# Patient Record
Sex: Female | Born: 1980 | Race: Black or African American | Hispanic: No | Marital: Married | Smoking: Former smoker
Health system: Southern US, Community
[De-identification: ages and names within clinical notes are randomized; demographics above are authoritative.]

## PROBLEM LIST (undated history)

## (undated) DIAGNOSIS — D649 Anemia, unspecified: Secondary | ICD-10-CM

## (undated) DIAGNOSIS — I10 Essential (primary) hypertension: Secondary | ICD-10-CM

## (undated) DIAGNOSIS — Z789 Other specified health status: Secondary | ICD-10-CM

## (undated) HISTORY — DX: Anemia, unspecified: D64.9

## (undated) HISTORY — DX: Essential (primary) hypertension: I10

## (undated) HISTORY — DX: Other specified health status: Z78.9

---

## 2004-11-19 ENCOUNTER — Emergency Department (HOSPITAL_COMMUNITY): Admission: EM | Admit: 2004-11-19 | Discharge: 2004-11-20 | Payer: Self-pay | Admitting: Emergency Medicine

## 2005-11-20 ENCOUNTER — Emergency Department: Payer: Self-pay | Admitting: Emergency Medicine

## 2007-01-04 ENCOUNTER — Emergency Department: Payer: Self-pay | Admitting: Emergency Medicine

## 2007-09-23 ENCOUNTER — Emergency Department (HOSPITAL_COMMUNITY): Admission: EM | Admit: 2007-09-23 | Discharge: 2007-09-23 | Payer: Self-pay | Admitting: Emergency Medicine

## 2009-07-21 ENCOUNTER — Other Ambulatory Visit: Admission: RE | Admit: 2009-07-21 | Discharge: 2009-07-21 | Payer: Self-pay | Admitting: Obstetrics & Gynecology

## 2010-12-03 ENCOUNTER — Emergency Department (HOSPITAL_COMMUNITY)
Admission: EM | Admit: 2010-12-03 | Discharge: 2010-12-04 | Disposition: A | Discharge status: Home / Self Care | Payer: Commercial Indemnity | Attending: Emergency Medicine | Admitting: Emergency Medicine

## 2010-12-03 ENCOUNTER — Emergency Department (HOSPITAL_COMMUNITY): Payer: Commercial Indemnity

## 2010-12-03 ENCOUNTER — Encounter: Payer: Self-pay | Admitting: *Deleted

## 2010-12-03 DIAGNOSIS — R102 Pelvic and perineal pain: Secondary | ICD-10-CM

## 2010-12-03 DIAGNOSIS — R1032 Left lower quadrant pain: Secondary | ICD-10-CM | POA: Insufficient documentation

## 2010-12-03 DIAGNOSIS — N898 Other specified noninflammatory disorders of vagina: Secondary | ICD-10-CM | POA: Insufficient documentation

## 2010-12-03 DIAGNOSIS — R109 Unspecified abdominal pain: Secondary | ICD-10-CM | POA: Insufficient documentation

## 2010-12-03 DIAGNOSIS — N739 Female pelvic inflammatory disease, unspecified: Secondary | ICD-10-CM

## 2010-12-03 DIAGNOSIS — R1031 Right lower quadrant pain: Secondary | ICD-10-CM | POA: Insufficient documentation

## 2010-12-03 DIAGNOSIS — D72829 Elevated white blood cell count, unspecified: Secondary | ICD-10-CM | POA: Insufficient documentation

## 2010-12-03 DIAGNOSIS — K5900 Constipation, unspecified: Secondary | ICD-10-CM | POA: Insufficient documentation

## 2010-12-03 DIAGNOSIS — N949 Unspecified condition associated with female genital organs and menstrual cycle: Secondary | ICD-10-CM | POA: Insufficient documentation

## 2010-12-03 LAB — COMPREHENSIVE METABOLIC PANEL
ALT: 15 U/L (ref 0–35)
Alkaline Phosphatase: 66 U/L (ref 39–117)
Chloride: 105 mEq/L (ref 96–112)
GFR calc Af Amer: >60 mL/min (ref >60)
Glucose, Bld: 107 mg/dL — ABNORMAL HIGH (ref 70–99)
Potassium: 3.6 mEq/L (ref 3.5–5.1)
Sodium: 138 mEq/L (ref 135–145)
Total Bilirubin: 0.6 mg/dL (ref 0.3–1.2)
Total Protein: 7.3 g/dL (ref 6.0–8.3)

## 2010-12-03 LAB — URINALYSIS, ROUTINE W REFLEX MICROSCOPIC
Bilirubin Urine: NEGATIVE
Glucose, UA: NEGATIVE mg/dL
Ketones, ur: NEGATIVE mg/dL
Nitrite: NEGATIVE
Protein, ur: NEGATIVE mg/dL
pH: 6 (ref 5.0–8.0)

## 2010-12-03 LAB — CBC
Hemoglobin: 11 g/dL — ABNORMAL LOW (ref 12.0–15.0)
Platelets: 275 10*3/uL (ref 150–400)
RBC: 4.99 MIL/uL (ref 3.87–5.11)
WBC: 19.8 10*3/uL — ABNORMAL HIGH (ref 4.0–10.5)

## 2010-12-03 LAB — DIFFERENTIAL
Eosinophils Absolute: 0 10*3/uL (ref 0.0–0.7)
Lymphocytes Relative: 11 % — ABNORMAL LOW (ref 12–46)
Lymphs Abs: 2.2 10*3/uL (ref 0.7–4.0)
Monocytes Relative: 7 % (ref 3–12)
Neutro Abs: 16.2 10*3/uL — ABNORMAL HIGH (ref 1.7–7.7)
Neutrophils Relative %: 82 % — ABNORMAL HIGH (ref 43–77)

## 2010-12-03 MED ORDER — MORPHINE SULFATE 4 MG/ML IJ SOLN
4.0000 mg | Freq: Once | INTRAMUSCULAR | Status: AC
  Administered 2010-12-03: 4 mg via INTRAVENOUS
  Filled 2010-12-03: qty 1

## 2010-12-03 MED ORDER — ONDANSETRON HCL 4 MG/2ML IJ SOLN
4.0000 mg | Freq: Once | INTRAMUSCULAR | Status: AC
  Administered 2010-12-03: 4 mg via INTRAVENOUS
  Filled 2010-12-03: qty 2

## 2010-12-03 MED ORDER — SODIUM CHLORIDE 0.9 % IV SOLN: 999.0000 mL | INTRAVENOUS | Status: DC

## 2010-12-03 MED ORDER — IOHEXOL 300 MG/ML  SOLN
100.0000 mL | Freq: Once | INTRAMUSCULAR | Status: AC | PRN
  Administered 2010-12-03: 100 mL via INTRAVENOUS

## 2010-12-03 MED ORDER — KETOROLAC TROMETHAMINE 30 MG/ML IJ SOLN
30.0000 mg | Freq: Once | INTRAMUSCULAR | Status: AC
  Administered 2010-12-03: 30 mg via INTRAVENOUS
  Filled 2010-12-03: qty 1

## 2010-12-03 NOTE — ED Notes (Signed)
Pt c/o lower abdominal cramping x 2 weeks. States that it got worse last night. Pt also c/o vaginal bleeding since July. Pt states that she was seen by her MD in July and was given some pills but they did not work. Denies nausea, vomiting or diarrhea.

## 2010-12-03 NOTE — ED Notes (Signed)
Pt ambulated to BR with steady gate.

## 2010-12-03 NOTE — ED Notes (Signed)
Pt states she was unable to urinate.

## 2010-12-04 ENCOUNTER — Encounter (HOSPITAL_COMMUNITY): Payer: Self-pay | Admitting: Emergency Medicine

## 2010-12-04 LAB — WET PREP, GENITAL: Trich, Wet Prep: NONE SEEN

## 2010-12-04 MED ORDER — METRONIDAZOLE 500 MG PO TABS: 500.0000 mg | ORAL_TABLET | Freq: Two times a day (BID) | ORAL | Status: AC

## 2010-12-04 MED ORDER — DOXYCYCLINE HYCLATE 100 MG PO CAPS: 100.0000 mg | ORAL_CAPSULE | Freq: Two times a day (BID) | ORAL | Status: AC

## 2010-12-04 MED ORDER — CEFTRIAXONE SODIUM 250 MG IJ SOLR
250.0000 mg | Freq: Once | INTRAMUSCULAR | Status: AC
  Administered 2010-12-04: 250 mg via INTRAMUSCULAR
  Filled 2010-12-04: qty 250

## 2010-12-04 MED ORDER — HYDROCODONE-ACETAMINOPHEN 5-325 MG PO TABS: ORAL_TABLET | ORAL | Status: AC

## 2010-12-04 MED ORDER — LIDOCAINE HCL 2 % IJ SOLN
INTRAMUSCULAR | Status: AC
  Administered 2010-12-04: 01:00:00
  Filled 2010-12-04: qty 1

## 2010-12-04 NOTE — ED Provider Notes (Signed)
History     CSN: [id number] Arrival date & time: 12/03/2010  7:06 PM  Chief Complaint  Patient presents with  . Abdominal Pain   HPI Comments: Patient c/o diffuse lower abdominal pain and constipation for several days.  States it has been several days since her last bowel movement.  She has not tried any OTC laxatives or stool softeners.  She also states that she has had abnormal periods and persistent vaginal bleeding for 2 months.  She has been seen for this by her GYN and is currently taking Megace.  She denies fever, vomiting, urinary sx's or chest pain or back pain  Patient is a 30 y.o. female presenting with abdominal pain. The history is provided by the patient.  Abdominal Pain The primary symptoms of the illness include abdominal pain and vaginal bleeding. The primary symptoms of the illness do not include fever, shortness of breath, nausea, vomiting, diarrhea, dysuria or vaginal discharge. The current episode started more than 2 days ago. The onset of the illness was gradual. The problem has not changed since onset. The abdominal pain began more than 2 days ago. The pain came on gradually. The abdominal pain has been unchanged since its onset. The abdominal pain is located in the LLQ, RLQ and suprapubic region. The abdominal pain does not radiate. The abdominal pain is relieved by nothing. Exacerbated by: nothing.  The patient states that she believes she is currently not pregnant. The patient has had a change in bowel habit. Additional symptoms associated with the illness include constipation. Symptoms associated with the illness do not include chills, diaphoresis, urgency, hematuria, frequency or back pain. Associated medical issues comments: has hx of abnormal vagianl bleeding and uterine fibroids.    History reviewed. No pertinent past medical history.  History reviewed. No pertinent past surgical history.  History reviewed. No pertinent family history.  History  Substance Use  Topics  . Smoking status: Never Smoker   . Smokeless tobacco: Not on file  . Alcohol Use: No    OB History    Grav Para Term Preterm Abortions TAB SAB Ect Mult Living                  Review of Systems  Constitutional: Negative for fever, chills and diaphoresis.  HENT: Negative for neck pain and neck stiffness.   Respiratory: Negative for chest tightness and shortness of breath.   Cardiovascular: Negative.   Gastrointestinal: Positive for abdominal pain and constipation. Negative for nausea, vomiting, diarrhea, anal bleeding and rectal pain.  Genitourinary: Positive for vaginal bleeding and menstrual problem. Negative for dysuria, urgency, frequency, hematuria, vaginal discharge and vaginal pain.  Musculoskeletal: Negative for back pain.  All other systems reviewed and are negative.    Physical Exam  BP 126/73  Pulse 93  Temp(Src) 99.1 F (37.3 C) (Oral)  Resp 20  Ht 5\' 4"  (1.626 m)  Wt 220 lb (99.791 kg)  BMI 37.76 kg/m2  SpO2 98%  LMP 10/08/2010  Physical Exam  Constitutional: She appears well-developed and well-nourished. No distress.  HENT:  Head: Normocephalic and atraumatic.  Mouth/Throat: Oropharynx is clear and moist.  Neck: Normal range of motion. Neck supple. No JVD present. No thyromegaly present.  Cardiovascular: Normal rate, regular rhythm and normal heart sounds.   No murmur heard. Pulmonary/Chest: Effort normal and breath sounds normal.  Abdominal: Soft. Bowel sounds are normal. She exhibits no distension, no ascites and no mass. There is no hepatosplenomegaly. There is tenderness in the right lower  quadrant and left lower quadrant. There is no rebound, no guarding, no CVA tenderness and no tenderness at McBurney's point.  Genitourinary: Rectal exam shows no external hemorrhoid, no mass and no tenderness. Guaiac negative stool. There is no rash, tenderness or lesion on the right labia. There is no rash, tenderness or lesion on the left labia. Uterus is  enlarged. Cervix exhibits motion tenderness. Right adnexum displays no mass and no tenderness. Left adnexum displays no mass and no tenderness. There is bleeding around the vagina. No tenderness around the vagina. No foreign body around the vagina.       Palpable mass anterior to the cervix, watery fluid mixed with blood in the vaginal vault.  Bilateral adnexa not well palpated due to body habitus.  Rectal exam resulted in soft, bown heme negative stool.  No obvious fecal impaction   Musculoskeletal: She exhibits no tenderness.  Lymphadenopathy:    She has no cervical adenopathy.  Neurological: She is alert. She has normal reflexes. No cranial nerve deficit. She exhibits normal muscle tone. Coordination normal.  Skin: Skin is warm and dry.  Psychiatric: She has a normal mood and affect.    ED Course  Procedures  MDM   Patient c/o diffuse lower abd pain and cramping for 2 weeks.  C/o constipation and rectal exam was performed.  GC and chlamydia cultures pending.  Pt has a palpable mass anterior to the cervix with mild CMT, uterus feels enlarged.  She is non-toxic appearing, has ambulated to the restroom w/o difficulty and vitals remain stable. Given the pt's leucocytosis,  I will begin abx  treatment for possible PID.  I have discussed the results with the patient , she agrees to close follow-up with Dr. Despina Hidden on Tuesday and verbalized understanding to me.  I have also discussed the hx, diagnostics and care plan with the EDP.   Patient / Family / Caregiver understand and agree with initial ED impression and plan with expectations set for ED visit.   Results for orders placed during the hospital encounter of 12/03/10  CBC      Component Value Range   WBC 19.8 (*) 4.0 - 10.5 (K/uL)   RBC 4.99  3.87 - 5.11 (MIL/uL)   Hemoglobin 11.0 (*) 12.0 - 15.0 (g/dL)   HCT 16.1 (*) 09.6 - 46.0 (%)   MCV 71.7 (*) 78.0 - 100.0 (fL)   MCH 22.0 (*) 26.0 - 34.0 (pg)   MCHC 30.7  30.0 - 36.0 (g/dL)   RDW 04.5  (*) 40.9 - 15.5 (%)   Platelets 275  150 - 400 (K/uL)  DIFFERENTIAL      Component Value Range   Neutrophils Relative 82 (*) 43 - 77 (%)   Neutro Abs 16.2 (*) 1.7 - 7.7 (K/uL)   Lymphocytes Relative 11 (*) 12 - 46 (%)   Lymphs Abs 2.2  0.7 - 4.0 (K/uL)   Monocytes Relative 7  3 - 12 (%)   Monocytes Absolute 1.5 (*) 0.1 - 1.0 (K/uL)   Eosinophils Relative 0  0 - 5 (%)   Eosinophils Absolute 0.0  0.0 - 0.7 (K/uL)   Basophils Relative 0  0 - 1 (%)   Basophils Absolute 0.0  0.0 - 0.1 (K/uL)  COMPREHENSIVE METABOLIC PANEL      Component Value Range   Sodium 138  135 - 145 (mEq/L)   Potassium 3.6  3.5 - 5.1 (mEq/L)   Chloride 105  96 - 112 (mEq/L)   CO2 21  19 -  32 (mEq/L)   Glucose, Bld 107 (*) 70 - 99 (mg/dL)   BUN 10  6 - 23 (mg/dL)   Creatinine, Ser 7.82  0.50 - 1.10 (mg/dL)   Calcium 9.0  8.4 - 95.6 (mg/dL)   Total Protein 7.3  6.0 - 8.3 (g/dL)   Albumin 3.6  3.5 - 5.2 (g/dL)   AST 14  0 - 37 (U/L)   ALT 15  0 - 35 (U/L)   Alkaline Phosphatase 66  39 - 117 (U/L)   Total Bilirubin 0.6  0.3 - 1.2 (mg/dL)   GFR calc non Af Amer >60  >60 (mL/min)   GFR calc Af Amer >60  >60 (mL/min)  URINALYSIS, ROUTINE W REFLEX MICROSCOPIC      Component Value Range   Color, Urine YELLOW  YELLOW    Appearance HAZY (*) CLEAR    Specific Gravity, Urine >1.030 (*) 1.005 - 1.030    pH 6.0  5.0 - 8.0    Glucose, UA NEGATIVE  NEGATIVE (mg/dL)   Hgb urine dipstick NEGATIVE  NEGATIVE    Bilirubin Urine NEGATIVE  NEGATIVE    Ketones, ur NEGATIVE  NEGATIVE (mg/dL)   Protein, ur NEGATIVE  NEGATIVE (mg/dL)   Urobilinogen, UA 0.2  0.0 - 1.0 (mg/dL)   Nitrite NEGATIVE  NEGATIVE    Leukocytes, UA NEGATIVE  NEGATIVE   POCT PREGNANCY, URINE      Component Value Range   Preg Test, Ur NEGATIVE    WET PREP, GENITAL      Component Value Range   Yeast, Wet Prep NONE SEEN  NONE SEEN    Trich, Wet Prep NONE SEEN  NONE SEEN    Clue Cells, Wet Prep FEW (*) NONE SEEN    WBC, Wet Prep HPF POC FEW (*) NONE SEEN          Dg Abd 1 View  12/03/2010  *RADIOLOGY REPORT*  Clinical Data: Lower abdominal pain and constipation  ABDOMEN - 1 VIEW  Comparison: None.  Findings: Normal bowel gas pattern with scattered gas and stool in the colon.  No bowel distension.  No radiopaque stones demonstrated.  Calcification in the pelvis consistent with phlebolith.  Visualized bones appear intact.  IMPRESSION: Nonobstructive bowel gas pattern.  Original Report Authenticated By: Marlon Pel, M.D.     Ct Abdomen Pelvis W Contrast  12/03/2010  *RADIOLOGY REPORT*  Clinical Data: Lower abdominal pain unable to urinate.  Vaginal bleeding since July.  CT ABDOMEN AND PELVIS WITH CONTRAST  Technique:  Multidetector CT imaging of the abdomen and pelvis was performed following the standard protocol during bolus administration of intravenous contrast.  Contrast: 100 ml Omnipaque 300  Comparison: None.  Findings: Lung bases are clear.  The liver, spleen, gallbladder, bile ducts, pancreas, adrenal glands, the stomach, small bowel, the kidneys, abdominal aorta, and retroperitoneal lymph nodes are unremarkable.  No free air free fluid in the abdomen.  There are scattered mesenteric and right lower quadrant lymph nodes which are mildly prominent probably representing reactive nodes.  Pelvis:  There is prominent expansion of a fluid-filled lower uterine segment, measuring up to about 5.1 x 11.5 x 4.6 cm.  The cervical region appears irregular.  Fluid extends up into the endometrium.  There is fluid and gas in the vaginal region. Changes are most worrisome for endometrial obstruction and a cervical mass should be excluded.  There is also a nodular enlargement of the uterus consistent with uterine fibroids. Ovaries are not significantly enlarged.  The  bladder is displaced and decompressed.  No free or loculated intrapelvic fluid collections.  No inflammatory changes in the colon.  The appendix is normal. No significant lymphadenopathy in the pelvis.   IMPRESSION: Marked fluid distension of the lower uterine segment extending into the endometrial canal.  Changes are worrisome for obstructing cervical lesion.  Multiple fibroids in the uterus is well.  Original Report Authenticated By: Marlon Pel, M.D.    MEDICATIONS GIVEN IN THE ED:   Medications  0.9 %  sodium chloride infusion (0 mL Intravenous Stopped 12/03/10 1943)  megestrol (MEGACE) 40 MG tablet (not administered)  ketorolac (TORADOL) injection 30 mg (30 mg Intravenous Given 12/03/10 1947)  ondansetron (ZOFRAN) injection 4 mg (4 mg Intravenous Given 12/03/10 1946)  morphine injection 4 mg (4 mg Intravenous Given 12/03/10 2254)  iohexol (OMNIPAQUE) 300 MG/ML injection 100 mL (100 mL Intravenous Contrast Given 12/03/10 2317)  cefTRIAXone (ROCEPHIN) injection 250 mg (250 mg Intramuscular Given 12/04/10 0113)  lidocaine (XYLOCAINE) 2 % (with pres) injection (   Given 12/04/10 0115)     OUTPATIENT MEDICATIONS PRESCRIBED FROM THE ED:      Patient's Medications  New Prescriptions   DOXYCYCLINE (VIBRAMYCIN) 100 MG CAPSULE    Take 1 capsule (100 mg total) by mouth 2 (two) times daily. For 10 days   HYDROCODONE-ACETAMINOPHEN (NORCO) 5-325 MG PER TABLET    Take one-two tabs po q 4-6 hrs prn pain   METRONIDAZOLE (FLAGYL) 500 MG TABLET    Take 1 tablet (500 mg total) by mouth 2 (two) times daily. For 10 days  Previous Medications   MEGESTROL (MEGACE) 40 MG TABLET    Take 40 mg by mouth daily. Patient takes 3 tablets daily   Modified Medications   No medications on file  Discontinued Medications   No medications on file     Leticia Mcdiarmid L. [place], [place] 12/09/10 [phone]

## 2010-12-04 NOTE — ED Notes (Signed)
IV d/c'd from Rt. AC, catheter intact and site WNL

## 2010-12-06 LAB — GC/CHLAMYDIA PROBE AMP, GENITAL
Chlamydia, DNA Probe: NEGATIVE
GC Probe Amp, Genital: NEGATIVE

## 2010-12-06 LAB — OCCULT BLOOD, POC DEVICE: Fecal Occult Bld: NEGATIVE

## 2010-12-11 NOTE — ED Provider Notes (Signed)
Medical screening examination/treatment/procedure(s) were performed by non-physician practitioner and as supervising physician I was immediately available for consultation/collaboration.   Vida Roller, MD 12/11/10 [phone]

## 2011-02-10 ENCOUNTER — Other Ambulatory Visit: Payer: Self-pay | Admitting: Obstetrics & Gynecology

## 2011-02-10 DIAGNOSIS — Z139 Encounter for screening, unspecified: Secondary | ICD-10-CM

## 2011-02-21 ENCOUNTER — Ambulatory Visit (HOSPITAL_COMMUNITY)
Admission: RE | Admit: 2011-02-21 | Discharge: 2011-02-21 | Disposition: A | Discharge status: Home / Self Care | Payer: Commercial Indemnity | Source: Ambulatory Visit | Attending: Obstetrics & Gynecology | Admitting: Obstetrics & Gynecology

## 2011-02-21 DIAGNOSIS — Z803 Family history of malignant neoplasm of breast: Secondary | ICD-10-CM | POA: Insufficient documentation

## 2011-02-21 DIAGNOSIS — Z139 Encounter for screening, unspecified: Secondary | ICD-10-CM

## 2011-02-21 DIAGNOSIS — Z1231 Encounter for screening mammogram for malignant neoplasm of breast: Secondary | ICD-10-CM | POA: Insufficient documentation

## 2011-10-09 ENCOUNTER — Other Ambulatory Visit: Payer: Self-pay | Admitting: Obstetrics & Gynecology

## 2012-04-02 NOTE — Patient Instructions (Addendum)
20 Alexandra Mclaughlin  04/02/2012   Your procedure is scheduled on:  04/10/2012  Report to [facility] at  700  AM.  Call this number if you have problems the morning of surgery: [phone]   Remember:   Do not eat food:After Midnight.  May have clear liquids:until Midnight .    Take these medicines the morning of surgery with A SIP OF WATER:  none   Do not wear jewelry, make-up or nail polish.  Do not wear lotions, powders, or perfumes.   Do not shave 48 hours prior to surgery. Men may shave face and neck.  Do not bring valuables to the hospital.  Contacts, dentures or bridgework may not be worn into surgery.  Leave suitcase in the car. After surgery it may be brought to your room.  For patients admitted to the hospital, checkout time is 11:00 AM the day of discharge.   Patients discharged the day of surgery will not be allowed to drive home.  Name and phone number of your driver: family  Special Instructions: Shower using CHG 2 nights before surgery and the night before surgery.  If you shower the day of surgery use CHG.  Use special wash - you have one bottle of CHG for all showers.  You should use approximately 1/3 of the bottle for each shower.   Please read over the following fact sheets that you were given: Pain Booklet, Coughing and Deep Breathing, MRSA Information, Surgical Site Infection Prevention, Anesthesia Post-op Instructions and Care and Recovery After Surgery Supracervical Hysterectomy A supracervical hysterectomy is minimally invasive surgery to remove the top part of the uterus, but not the cervix. This surgery can be performed by making a large cut (incision) in the abdomen. It can also be done with a thin, lighted tube (laparoscope) inserted into 2 small incisions in the lower abdomen. Your fallopian tubes and ovaries can be removed (bilateral salpingo-oopherectomy) during this surgery as well. If a supracervical hysterectomy is started and it is not safe to continue,  the laparoscopic surgery will be converted to an open abdominal surgery. You will not have menstrual periods or be able to get pregnant after having this surgery. If a bilateral salpingo-oopherectomy was performed before menopause, you will go through a sudden (abrupt) menopause. This can be helped with hormone medicines. Benefits of minimally invasive surgery include:  Less pain.  Less risk of blood loss.  Less risk of infection.  Quicker return to normal activities.  Usually a 1 night stay in the hospital.  Overall patient satisfaction. LET YOUR CAREGIVER KNOW ABOUT:  Any history of abnormal Pap tests.  Allergies to food or medicine.  Medicines taken, including vitamins, herbs, eyedrops, over-the-counter medicines, and creams.  Use of steroids (by mouth or creams).  Previous problems with anesthetics or numbing medicines.  History of bleeding problems or blood clots.  Previous surgery.  Other health problems, including diabetes and kidney problems.  Any infections or colds you may have developed.  Symptoms of irregular or heavy periods, weight loss, or urinary or bowel changes. RISKS AND COMPLICATIONS   Bleeding.  Blood clots in the legs or lung.  Infection.  Injury to surrounding organs.  Problems with anesthesia.  Risk of conversion to an open abdominal incision.  Early menopause symptoms (hot flashes, night sweats, insomnia).  Additional surgery later to remove the cervix if you have problems with the cervix. BEFORE THE PROCEDURE  Ask your caregiver about changing or stopping your regular  medicines.  Do not take aspirin or blood thinners (anticoagulants) for 1 week before the surgery, or as told by your caregiver.  Do not eat or drink anything for 8 hours before the surgery, or as told by your caregiver.  Quit smoking if you smoke.  Arrange for a ride home after surgery and for someone to help you at home during recovery. PROCEDURE   You will be  given an antibiotic medicine.  An intravenous (IV) line will be placed in your arm. You will be given medicine to make you sleep (general anesthetic).  A gas (carbon dioxide) will be used to inflate your abdomen. This will allow your surgeon to look inside your abdomen, perform your surgery, and treat any other problems found if necessary.  Three or four small incisions (often less than  inch) will be made in your abdomen. One of these incisions will be made in the area of your belly button (navel). The laparoscope will be inserted into the incision. Your surgeon will look through the laparoscope while doing your procedure.  Other surgical instruments will be inserted through the other incisions.  The uterus will be cut into small pieces and removed through the small incisions.  Your incisions will be closed. AFTER THE PROCEDURE   The gas will be released from inside your abdomen.  You will be taken to the recovery area where a nurse will watch and check your progress. Once you are awake, stable, and taking fluids well, without other problems, you will return to your room or be allowed to go home.  There is usually minimal discomfort following the surgery because the incisions are so small.  You will be given pain medicine while you are in the hospital and for when you go home.  Try to have someone with you for the first 3 to 5 days after you go home.  Follow up with your surgeon in 2 to 4 weeks after surgery to evaluate your progress. Document Released: 09/06/2007 Document Revised: 06/12/2011 Document Reviewed: 11/04/2010 [facility] Patient Information 2013 [place], [place]. PATIENT INSTRUCTIONS POST-ANESTHESIA  IMMEDIATELY FOLLOWING SURGERY:  Do not drive or operate machinery for the first twenty four hours after surgery.  Do not make any important decisions for twenty four hours after surgery or while taking narcotic pain medications or sedatives.  If you develop intractable nausea  and vomiting or a severe headache please notify your doctor immediately.  FOLLOW-UP:  Please make an appointment with your surgeon as instructed. You do not need to follow up with anesthesia unless specifically instructed to do so.  WOUND CARE INSTRUCTIONS (if applicable):  Keep a dry clean dressing on the anesthesia/puncture wound site if there is drainage.  Once the wound has quit draining you may leave it open to air.  Generally you should leave the bandage intact for twenty four hours unless there is drainage.  If the epidural site drains for more than 36-48 hours please call the anesthesia department.  QUESTIONS?:  Please feel free to call your physician or the hospital operator if you have any questions, and they will be happy to assist you.

## 2012-04-04 ENCOUNTER — Encounter (HOSPITAL_COMMUNITY)
Admission: RE | Admit: 2012-04-04 | Discharge: 2012-04-04 | Payer: Commercial Indemnity | Source: Ambulatory Visit | Attending: Obstetrics & Gynecology | Admitting: Obstetrics & Gynecology

## 2012-04-04 ENCOUNTER — Other Ambulatory Visit: Payer: Self-pay | Admitting: Obstetrics & Gynecology

## 2012-04-10 ENCOUNTER — Encounter (HOSPITAL_COMMUNITY): Admission: RE | Payer: Self-pay | Source: Ambulatory Visit

## 2012-04-10 ENCOUNTER — Inpatient Hospital Stay (HOSPITAL_COMMUNITY)
Admission: RE | Admit: 2012-04-10 | Payer: Commercial Indemnity | Source: Ambulatory Visit | Admitting: Obstetrics & Gynecology

## 2012-04-10 SURGERY — HYSTERECTOMY, SUPRACERVICAL, ABDOMINAL
Anesthesia: General

## 2012-04-22 ENCOUNTER — Emergency Department (HOSPITAL_COMMUNITY)
Admission: EM | Admit: 2012-04-22 | Discharge: 2012-04-22 | Disposition: A | Discharge status: Home / Self Care | Payer: BC Managed Care – PPO | Attending: Emergency Medicine | Admitting: Emergency Medicine

## 2012-04-22 ENCOUNTER — Emergency Department (HOSPITAL_COMMUNITY): Payer: BC Managed Care – PPO

## 2012-04-22 ENCOUNTER — Encounter (HOSPITAL_COMMUNITY): Payer: Self-pay

## 2012-04-22 DIAGNOSIS — R509 Fever, unspecified: Secondary | ICD-10-CM | POA: Insufficient documentation

## 2012-04-22 DIAGNOSIS — R112 Nausea with vomiting, unspecified: Secondary | ICD-10-CM | POA: Insufficient documentation

## 2012-04-22 DIAGNOSIS — R000 Tachycardia, unspecified: Secondary | ICD-10-CM | POA: Insufficient documentation

## 2012-04-22 DIAGNOSIS — R059 Cough, unspecified: Secondary | ICD-10-CM | POA: Insufficient documentation

## 2012-04-22 DIAGNOSIS — IMO0001 Reserved for inherently not codable concepts without codable children: Secondary | ICD-10-CM | POA: Insufficient documentation

## 2012-04-22 DIAGNOSIS — J029 Acute pharyngitis, unspecified: Secondary | ICD-10-CM | POA: Insufficient documentation

## 2012-04-22 DIAGNOSIS — R05 Cough: Secondary | ICD-10-CM | POA: Insufficient documentation

## 2012-04-22 DIAGNOSIS — J111 Influenza due to unidentified influenza virus with other respiratory manifestations: Secondary | ICD-10-CM

## 2012-04-22 LAB — BASIC METABOLIC PANEL
BUN: 11 mg/dL (ref 6–23)
Calcium: 9.3 mg/dL (ref 8.4–10.5)
Creatinine, Ser: 0.73 mg/dL (ref 0.50–1.10)
GFR calc Af Amer: >90 mL/min (ref >90)
GFR calc non Af Amer: >90 mL/min (ref >90)

## 2012-04-22 LAB — CBC WITH DIFFERENTIAL/PLATELET
Basophils Absolute: 0 10*3/uL (ref 0.0–0.1)
Basophils Relative: 0 % (ref 0–1)
Eosinophils Absolute: 0 10*3/uL (ref 0.0–0.7)
Eosinophils Relative: 0 % (ref 0–5)
HCT: 43.5 % (ref 36.0–46.0)
MCH: 24.3 pg — ABNORMAL LOW (ref 26.0–34.0)
MCHC: 32.6 g/dL (ref 30.0–36.0)
MCV: 74.4 fL — ABNORMAL LOW (ref 78.0–100.0)
Monocytes Absolute: 0.8 10*3/uL (ref 0.1–1.0)
Neutro Abs: 4 10*3/uL (ref 1.7–7.7)
RDW: 17.2 % — ABNORMAL HIGH (ref 11.5–15.5)

## 2012-04-22 LAB — RAPID STREP SCREEN (MED CTR MEBANE ONLY): Streptococcus, Group A Screen (Direct): NEGATIVE

## 2012-04-22 MED ORDER — SODIUM CHLORIDE 0.9 % IV BOLUS (SEPSIS)
1000.0000 mL | Freq: Once | INTRAVENOUS | Status: AC
  Administered 2012-04-22: 1000 mL via INTRAVENOUS

## 2012-04-22 MED ORDER — ONDANSETRON HCL 4 MG/2ML IJ SOLN
4.0000 mg | Freq: Once | INTRAMUSCULAR | Status: AC
  Administered 2012-04-22: 4 mg via INTRAVENOUS
  Filled 2012-04-22: qty 2

## 2012-04-22 MED ORDER — SODIUM CHLORIDE 0.9 % IV SOLN
INTRAVENOUS | Status: DC
  Administered 2012-04-22: 13:00:00 via INTRAVENOUS

## 2012-04-22 MED ORDER — MUCINEX DM 30-600 MG PO TB12: 1.0000 | ORAL_TABLET | Freq: Two times a day (BID) | ORAL | Status: DC

## 2012-04-22 MED ORDER — NAPROXEN 500 MG PO TABS: 500.0000 mg | ORAL_TABLET | Freq: Two times a day (BID) | ORAL | Status: DC

## 2012-04-22 NOTE — ED Notes (Signed)
Body aches, n/v and fever since Friday. Along with sore throat as well.

## 2012-04-22 NOTE — ED Provider Notes (Signed)
History    This chart was scribed for Shelda Jakes, MD, MD by Smitty Pluck, ED Scribe. The patient was seen in room APA08/APA08 and the patient's care was started at 10:48 AM.   CSN: [id number]  Arrival date & time 04/22/12  4098     Chief Complaint  Patient presents with  . Influenza    Patient is a 32 y.o. female presenting with flu symptoms. The history is provided by the patient. No language interpreter was used.  Influenza This is a new problem. The current episode started more than 2 days ago. The problem occurs constantly. The problem has not changed since onset.Pertinent negatives include no chest pain, no abdominal pain and no shortness of breath. Nothing aggravates the symptoms. Nothing relieves the symptoms. She has tried nothing for the symptoms.   Alexandra Mclaughlin is a 32 y.o. female who presents to the Emergency Department complaining of constant, moderate generalized body aches onset 3 days ago. Pt reports that she has productive cough, nausea, vomiting (2x), fever (current temperature in ED is 98.4), congestion, chills and sore throat. Pt has not taken medication PTA, diarrhea, dysuria, rash, headache and any other pain. LMP was this month.     History reviewed. No pertinent past medical history.  History reviewed. No pertinent past surgical history.  No family history on file.  History  Substance Use Topics  . Smoking status: Never Smoker   . Smokeless tobacco: Not on file  . Alcohol Use: No    OB History    Grav Para Term Preterm Abortions TAB SAB Ect Mult Living                  Review of Systems  Constitutional: Positive for fever and chills.  HENT: Positive for congestion and sore throat.   Respiratory: Positive for cough. Negative for shortness of breath.   Cardiovascular: Negative for chest pain.  Gastrointestinal: Positive for nausea and vomiting. Negative for abdominal pain and diarrhea.  Genitourinary: Negative for dysuria.  Skin: Negative  for rash.  All other systems reviewed and are negative.    Allergies  Review of patient's allergies indicates no known allergies.  Home Medications   Current Outpatient Rx  Name  Route  Sig  Dispense  Refill  . ACETAMINOPHEN 500 MG PO TABS   Oral   Take 1,000 mg by mouth every 6 (six) hours as needed. Pain         . MEGESTROL ACETATE 40 MG PO TABS   Oral   Take 40 mg by mouth 3 (three) times daily.          Marland Kitchen MUCINEX DM 30-600 MG PO TB12   Oral   Take 1 tablet by mouth every 12 (twelve) hours.   14 each   0   . NAPROXEN 500 MG PO TABS   Oral   Take 1 tablet (500 mg total) by mouth 2 (two) times daily.   14 tablet   0     BP 122/81  Pulse 124  Temp 98.4 F (36.9 C)  Resp 18  SpO2 97%  LMP 03/11/2012  Physical Exam  Nursing note and vitals reviewed. Constitutional: She is oriented to person, place, and time. She appears well-developed and well-nourished. No distress.  HENT:  Head: Normocephalic and atraumatic.  Mouth/Throat: Oropharynx is clear and moist.       Mild white coating on tongue   Eyes: EOM are normal. Pupils are equal, round, and reactive to  light.  Neck: Normal range of motion. Neck supple. No tracheal deviation present.  Cardiovascular: Regular rhythm and normal heart sounds.  Tachycardia present.   No murmur heard. Pulmonary/Chest: Effort normal and breath sounds normal. No respiratory distress. She has no wheezes. She has no rales.  Abdominal: Soft. Bowel sounds are normal. She exhibits no distension. There is no tenderness. There is no rebound and no guarding.  Musculoskeletal: Normal range of motion.  Lymphadenopathy:    She has no cervical adenopathy.  Neurological: She is alert and oriented to person, place, and time. No cranial nerve deficit.  Skin: Skin is warm and dry.  Psychiatric: She has a normal mood and affect. Her behavior is normal.    ED Course  Procedures (including critical care time) DIAGNOSTIC STUDIES: Oxygen  Saturation is 97% on room air, adequate by my interpretation.    COORDINATION OF CARE: 10:51 AM Discussed ED treatment with pt  11:15 AM Ordered:   Medications  acetaminophen (TYLENOL) 500 MG tablet (not administered)  0.9 %  sodium chloride infusion (  Intravenous New Bag/Given 04/22/12 1303)  naproxen (NAPROSYN) 500 MG tablet (not administered)  Dextromethorphan-Guaifenesin (MUCINEX DM) 30-600 MG TB12 (not administered)  sodium chloride 0.9 % bolus 1,000 mL (0 mL Intravenous Stopped 04/22/12 1303)  ondansetron (ZOFRAN) injection 4 mg (4 mg Intravenous Given 04/22/12 1120)       Labs Reviewed  CBC WITH DIFFERENTIAL - Abnormal; Notable for the following:    RBC 5.85 (*)     MCV 74.4 (*)     MCH 24.3 (*)     RDW 17.2 (*)     Monocytes Relative 13 (*)     All other components within normal limits  BASIC METABOLIC PANEL - Abnormal; Notable for the following:    Potassium 3.4 (*)     All other components within normal limits  RAPID STREP SCREEN   Dg Chest 2 View  04/22/2012  *RADIOLOGY REPORT*  Clinical Data: Flu-like symptoms.  CHEST - 2 VIEW  Comparison: None.  Findings: Linear densities in the lung bases bilaterally, likely atelectasis.  Heart is normal size.  No effusions.  No acute bony abnormality.  IMPRESSION: Bibasilar atelectasis.   Original Report Authenticated By: Charlett Nose, M.D.    Results for orders placed during the hospital encounter of 04/22/12  RAPID STREP SCREEN      Component Value Range   Streptococcus, Group A Screen (Direct) NEGATIVE  NEGATIVE  CBC WITH DIFFERENTIAL      Component Value Range   WBC 6.1  4.0 - 10.5 K/uL   RBC 5.85 (*) 3.87 - 5.11 MIL/uL   Hemoglobin 14.2  12.0 - 15.0 g/dL   HCT 40.9  81.1 - 91.4 %   MCV 74.4 (*) 78.0 - 100.0 fL   MCH 24.3 (*) 26.0 - 34.0 pg   MCHC 32.6  30.0 - 36.0 g/dL   RDW 78.2 (*) 95.6 - 21.3 %   Platelets 201  150 - 400 K/uL   Neutrophils Relative 65  43 - 77 %   Neutro Abs 4.0  1.7 - 7.7 K/uL   Lymphocytes  Relative 22  12 - 46 %   Lymphs Abs 1.3  0.7 - 4.0 K/uL   Monocytes Relative 13 (*) 3 - 12 %   Monocytes Absolute 0.8  0.1 - 1.0 K/uL   Eosinophils Relative 0  0 - 5 %   Eosinophils Absolute 0.0  0.0 - 0.7 K/uL   Basophils Relative 0  0 - 1 %   Basophils Absolute 0.0  0.0 - 0.1 K/uL   Smear Review PLATELET COUNT CONFIRMED BY SMEAR    BASIC METABOLIC PANEL      Component Value Range   Sodium 136  135 - 145 mEq/L   Potassium 3.4 (*) 3.5 - 5.1 mEq/L   Chloride 101  96 - 112 mEq/L   CO2 25  19 - 32 mEq/L   Glucose, Bld 94  70 - 99 mg/dL   BUN 11  6 - 23 mg/dL   Creatinine, Ser 1.61  0.50 - 1.10 mg/dL   Calcium 9.3  8.4 - 09.6 mg/dL   GFR calc non Af Amer >90  >90 mL/min   GFR calc Af Amer >90  >90 mL/min     1. Influenza       MDM  Workup in the emergency department negative for pneumonia negative for strep throat symptoms most consistent with influenza-like illness. Patient has had symptoms since Friday so not a candidate for Tamiflu. We'll treat with anti-inflammatories for the bodyaches and Mucinex DM for the cough and phlegm. Patient will return for any new or worse symptoms. Work note provided to be off until Monday.  Patient is nontoxic no acute distress.    I personally performed the services described in this documentation, which was scribed in my presence. The recorded information has been reviewed and is accurate.    Shelda Jakes, MD 04/22/12 1425

## 2013-01-09 ENCOUNTER — Other Ambulatory Visit: Payer: Self-pay | Admitting: Obstetrics & Gynecology

## 2013-01-23 ENCOUNTER — Other Ambulatory Visit: Payer: Self-pay | Admitting: Obstetrics & Gynecology

## 2013-02-12 ENCOUNTER — Other Ambulatory Visit: Payer: Self-pay | Admitting: Obstetrics & Gynecology

## 2013-03-05 ENCOUNTER — Other Ambulatory Visit: Payer: Self-pay | Admitting: Obstetrics & Gynecology

## 2013-06-19 ENCOUNTER — Other Ambulatory Visit: Payer: Self-pay | Admitting: Obstetrics & Gynecology

## 2013-06-26 ENCOUNTER — Ambulatory Visit (INDEPENDENT_AMBULATORY_CARE_PROVIDER_SITE_OTHER): Payer: BC Managed Care – PPO | Admitting: Obstetrics & Gynecology

## 2013-06-26 ENCOUNTER — Encounter (INDEPENDENT_AMBULATORY_CARE_PROVIDER_SITE_OTHER): Payer: Self-pay

## 2013-06-26 ENCOUNTER — Other Ambulatory Visit (HOSPITAL_COMMUNITY)
Admission: RE | Admit: 2013-06-26 | Discharge: 2013-06-26 | Disposition: A | Discharge status: Home / Self Care | Payer: BC Managed Care – PPO | Source: Ambulatory Visit | Attending: Obstetrics & Gynecology | Admitting: Obstetrics & Gynecology

## 2013-06-26 ENCOUNTER — Encounter: Payer: Self-pay | Admitting: Obstetrics & Gynecology

## 2013-06-26 VITALS — BP 140/80 | Ht 65.0 in | Wt 232.0 lb

## 2013-06-26 DIAGNOSIS — Z1151 Encounter for screening for human papillomavirus (HPV): Secondary | ICD-10-CM | POA: Insufficient documentation

## 2013-06-26 DIAGNOSIS — Z01419 Encounter for gynecological examination (general) (routine) without abnormal findings: Secondary | ICD-10-CM

## 2013-06-26 NOTE — Progress Notes (Signed)
Patient ID: Alexandra Mclaughlin, female   DOB: [birth year], 33 y.o.   MRN: [id number] Subjective:     Alexandra Mclaughlin is a 33 y.o. female here for a routine exam.  No LMP recorded. Patient is not currently having periods (Reason: Oral contraceptives). No obstetric history on file. Birth Control Method:  none Menstrual Calendar(currently): amenorrheic  Current complaints: none.   Current acute medical issues:  none   Recent Gynecologic History No LMP recorded. Patient is not currently having periods (Reason: Oral contraceptives). Last Pap: 2011?,  normal Last mammogram: ,    History reviewed. No pertinent past medical history.  History reviewed. No pertinent past surgical history.  OB History   Grav Para Term Preterm Abortions TAB SAB Ect Mult Living                  History   Social History  . Marital Status: Married    Spouse Name: N/A    Number of Children: N/A  . Years of Education: N/A   Social History Main Topics  . Smoking status: Never Smoker   . Smokeless tobacco: None  . Alcohol Use: No  . Drug Use: No  . Sexual Activity: Yes    Birth Control/ Protection: None   Other Topics Concern  . None   Social History Narrative  . None    History reviewed. No pertinent family history.   Review of Systems  Review of Systems  Constitutional: Negative for fever, chills, weight loss, malaise/fatigue and diaphoresis.  HENT: Negative for hearing loss, ear pain, nosebleeds, congestion, sore throat, neck pain, tinnitus and ear discharge.   Eyes: Negative for blurred vision, double vision, photophobia, pain, discharge and redness.  Respiratory: Negative for cough, hemoptysis, sputum production, shortness of breath, wheezing and stridor.   Cardiovascular: Negative for chest pain, palpitations, orthopnea, claudication, leg swelling and PND.  Gastrointestinal: negative for abdominal pain. Negative for heartburn, nausea, vomiting, diarrhea, constipation, blood in stool and  melena.  Genitourinary: Negative for dysuria, urgency, frequency, hematuria and flank pain.  Musculoskeletal: Negative for myalgias, back pain, joint pain and falls.  Skin: Negative for itching and rash.  Neurological: Negative for dizziness, tingling, tremors, sensory change, speech change, focal weakness, seizures, loss of consciousness, weakness and headaches.  Endo/Heme/Allergies: Negative for environmental allergies and polydipsia. Does not bruise/bleed easily.  Psychiatric/Behavioral: Negative for depression, suicidal ideas, hallucinations, memory loss and substance abuse. The patient is not nervous/anxious and does not have insomnia.        Objective:    Physical Exam  Vitals reviewed. Constitutional: She is oriented to person, place, and time. She appears well-developed and well-nourished.  HENT:  Head: Normocephalic and atraumatic.        Right Ear: External ear normal.  Left Ear: External ear normal.  Nose: Nose normal.  Mouth/Throat: Oropharynx is clear and moist.  Eyes: Conjunctivae and EOM are normal. Pupils are equal, round, and reactive to light. Right eye exhibits no discharge. Left eye exhibits no discharge. No scleral icterus.  Neck: Normal range of motion. Neck supple. No tracheal deviation present. No thyromegaly present.  Cardiovascular: Normal rate, regular rhythm, normal heart sounds and intact distal pulses.  Exam reveals no gallop and no friction rub.   No murmur heard. Respiratory: Effort normal and breath sounds normal. No respiratory distress. She has no wheezes. She has no rales. She exhibits no tenderness.  GI: Soft. Bowel sounds are normal. She exhibits no distension and no mass. There is no tenderness.  There is no rebound and no guarding.  Genitourinary:  Breasts no masses skin changes or nipple changes bilaterally      Vulva is normal without lesions Vagina is pink moist without discharge Cervix normal in appearance and pap is done Uterus is normal  size shape and contour Adnexa is negative with normal sized ovaries   Musculoskeletal: Normal range of motion. She exhibits no edema and no tenderness.  Neurological: She is alert and oriented to person, place, and time. She has normal reflexes. She displays normal reflexes. No cranial nerve deficit. She exhibits normal muscle tone. Coordination normal.  Skin: Skin is warm and dry. No rash noted. No erythema. No pallor.  Psychiatric: She has a normal mood and affect. Her behavior is normal. Judgment and thought content normal.       Assessment:    Healthy female exam.    Plan:    Follow up in: 1 year.

## 2013-06-26 NOTE — Addendum Note (Signed)
Addended by: Linton Rump on: 06/26/2013 04:20 PM   Modules accepted: Orders

## 2013-07-23 ENCOUNTER — Other Ambulatory Visit: Payer: Self-pay | Admitting: Obstetrics & Gynecology

## 2013-07-23 DIAGNOSIS — Z1231 Encounter for screening mammogram for malignant neoplasm of breast: Secondary | ICD-10-CM

## 2013-07-28 ENCOUNTER — Ambulatory Visit (HOSPITAL_COMMUNITY)
Admission: RE | Admit: 2013-07-28 | Discharge: 2013-07-28 | Disposition: A | Discharge status: Home / Self Care | Payer: BC Managed Care – PPO | Source: Ambulatory Visit | Attending: Obstetrics & Gynecology | Admitting: Obstetrics & Gynecology

## 2013-07-28 DIAGNOSIS — Z1231 Encounter for screening mammogram for malignant neoplasm of breast: Secondary | ICD-10-CM | POA: Insufficient documentation

## 2014-02-07 ENCOUNTER — Encounter (HOSPITAL_COMMUNITY): Payer: Self-pay | Admitting: *Deleted

## 2014-02-07 ENCOUNTER — Emergency Department (HOSPITAL_COMMUNITY)
Admission: EM | Admit: 2014-02-07 | Discharge: 2014-02-07 | Disposition: A | Discharge status: Home / Self Care | Payer: BC Managed Care – PPO | Attending: Emergency Medicine | Admitting: Emergency Medicine

## 2014-02-07 ENCOUNTER — Emergency Department (HOSPITAL_COMMUNITY): Payer: BC Managed Care – PPO

## 2014-02-07 DIAGNOSIS — R109 Unspecified abdominal pain: Secondary | ICD-10-CM | POA: Diagnosis not present

## 2014-02-07 DIAGNOSIS — M549 Dorsalgia, unspecified: Secondary | ICD-10-CM | POA: Diagnosis not present

## 2014-02-07 DIAGNOSIS — O9989 Other specified diseases and conditions complicating pregnancy, childbirth and the puerperium: Secondary | ICD-10-CM | POA: Insufficient documentation

## 2014-02-07 DIAGNOSIS — Z791 Long term (current) use of non-steroidal anti-inflammatories (NSAID): Secondary | ICD-10-CM | POA: Diagnosis not present

## 2014-02-07 DIAGNOSIS — Z79899 Other long term (current) drug therapy: Secondary | ICD-10-CM | POA: Diagnosis not present

## 2014-02-07 DIAGNOSIS — Z3490 Encounter for supervision of normal pregnancy, unspecified, unspecified trimester: Secondary | ICD-10-CM

## 2014-02-07 LAB — URINALYSIS, ROUTINE W REFLEX MICROSCOPIC
Bilirubin Urine: NEGATIVE
GLUCOSE, UA: NEGATIVE mg/dL
HGB URINE DIPSTICK: NEGATIVE
Ketones, ur: NEGATIVE mg/dL
LEUKOCYTES UA: NEGATIVE
Nitrite: NEGATIVE
PH: 6.5 (ref 5.0–8.0)
PROTEIN: NEGATIVE mg/dL
SPECIFIC GRAVITY, URINE: 1.025 (ref 1.005–1.030)
Urobilinogen, UA: 0.2 mg/dL (ref 0.0–1.0)

## 2014-02-07 LAB — PREGNANCY, URINE: PREG TEST UR: POSITIVE — AB

## 2014-02-07 MED ORDER — PRENATAL COMPLETE 14-0.4 MG PO TABS: 1.0000 | ORAL_TABLET | Freq: Every day | ORAL | Status: DC

## 2014-02-07 NOTE — ED Notes (Signed)
Pt states sharp, intermittent pain to lower back and lower abdomen which began at 0900. Denies any other symptoms at this time.

## 2014-02-07 NOTE — ED Provider Notes (Signed)
CSN: [id number]     Arrival date & time 02/07/14  1723 History  This chart was scribed for Orpah Greek, * by Peyton Bottoms, ED Scribe. This patient was seen in room APA03/APA03 and the patient's care was started at 5:50 PM.   Chief Complaint  Patient presents with  . Abdominal Pain   Patient is a 33 y.o. female presenting with abdominal pain. The history is provided by the patient. No language interpreter was used.  Abdominal Pain Associated symptoms: no dysuria, no hematuria, no vaginal bleeding and no vaginal discharge      HPI Comments: KATHERLEEN FOLKES is a 33 y.o. female who presents to the Emergency Department complaining of moderate intermittent left sided abdominal pain which radiates to mid and lower back that began this morning. She states that the pain lasts for about 10 minutes when present. She denies associated dysuria, vaginal bleeding, vaginal discharge, vaginal pain, hematuria.  History reviewed. No pertinent past medical history. History reviewed. No pertinent past surgical history. No family history on file. History  Substance Use Topics  . Smoking status: Never Smoker   . Smokeless tobacco: Not on file  . Alcohol Use: No   OB History    No data available     Review of Systems  Gastrointestinal: Positive for abdominal pain.  Genitourinary: Negative for dysuria, hematuria, vaginal bleeding, vaginal discharge and vaginal pain.  Musculoskeletal: Positive for back pain.  All other systems reviewed and are negative.  Allergies  Review of patient's allergies indicates no known allergies.  Home Medications   Prior to Admission medications   Medication Sig Start Date End Date Taking? Authorizing Provider  acetaminophen (TYLENOL) 500 MG tablet Take 1,000 mg by mouth every 6 (six) hours as needed. Pain    Historical Provider, MD  Dextromethorphan-Guaifenesin (MUCINEX DM) 30-600 MG TB12 Take 1 tablet by mouth every 12 (twelve) hours. 04/22/12   Fredia Sorrow, MD  megestrol (MEGACE) 40 MG tablet Take 40 mg by mouth 3 (three) times daily.     Historical Provider, MD  naproxen (NAPROSYN) 500 MG tablet Take 1 tablet (500 mg total) by mouth 2 (two) times daily. 04/22/12   Fredia Sorrow, MD   Triage Vitals: BP 119/66 mmHg  Pulse 78  Temp(Src) 97.7 F (36.5 C) (Oral)  Resp 16  Ht 5\' 4"  (1.626 m)  Wt 215 lb (97.523 kg)  BMI 36.89 kg/m2  SpO2 100%  LMP   Physical Exam  Constitutional: She is oriented to person, place, and time. She appears well-developed and well-nourished. No distress.  HENT:  Head: Normocephalic and atraumatic.  Right Ear: Hearing normal.  Left Ear: Hearing normal.  Nose: Nose normal.  Mouth/Throat: Oropharynx is clear and moist and mucous membranes are normal.  Eyes: Conjunctivae and EOM are normal. Pupils are equal, round, and reactive to light.  Neck: Normal range of motion. Neck supple.  Cardiovascular: Regular rhythm, S1 normal and S2 normal.  Exam reveals no gallop and no friction rub.   No murmur heard. Pulmonary/Chest: Effort normal and breath sounds normal. No respiratory distress. She exhibits no tenderness.  Abdominal: Soft. Normal appearance and bowel sounds are normal. There is no hepatosplenomegaly. There is no tenderness. There is no rebound, no guarding, no tenderness at McBurney's point and negative Murphy's sign. No hernia.  Musculoskeletal: Normal range of motion.  Neurological: She is alert and oriented to person, place, and time. She has normal strength. No cranial nerve deficit or sensory deficit. Coordination normal. GCS  eye subscore is 4. GCS verbal subscore is 5. GCS motor subscore is 6.  Skin: Skin is warm, dry and intact. No rash noted. No cyanosis.  Psychiatric: She has a normal mood and affect. Her speech is normal and behavior is normal. Thought content normal.  Nursing note and vitals reviewed.  ED Course  Procedures (including critical care time)  DIAGNOSTIC STUDIES: Oxygen  Saturation is 100% on RA, normal by my interpretation.    COORDINATION OF CARE: 5:54 PM- Discussed plans to order diagnostic imaging and lab work. Pt advised of plan for treatment and pt agrees.  Labs Review Labs Reviewed - No data to display  Imaging Review No results found.   EKG Interpretation None     MDM   Final diagnoses:  None  pregnancy Pelvic pain, resolved  Patient presented to the ER for evaluation of left flank pain. Patient reports onset of pain in the left back and radiated into the groin region that lasted for approximately 10 minutes. The pain has completely resolved.he has not had any further symptoms here in the ER. Urinalysis and urine pregnancy was ordered. Urinalysis was clear, but urine pregnancy was positive. When I discussed this with the patient, she tells me that she did have some thoughts that she might be pregnant. She is 1 or 2 weeks overdue for her menstrual period. As the patient is not having any continuous pain, do not have any concern for ectopic. She has not had any bleeding or discharge. Patient was counseled to take prenatal vitamins and will follow up with OB/GYN. Patient was counseled to return immediately to the ER for any continuous pain, fever, or vaginal bleeding.  I personally performed the services described in this documentation, which was scribed in my presence. The recorded information has been reviewed and is accurate.   Orpah Greek, MD 02/07/14 1925

## 2014-02-07 NOTE — Discharge Instructions (Signed)

## 2014-02-09 ENCOUNTER — Other Ambulatory Visit: Payer: Self-pay | Admitting: Women's Health

## 2014-02-09 ENCOUNTER — Ambulatory Visit (INDEPENDENT_AMBULATORY_CARE_PROVIDER_SITE_OTHER): Payer: BC Managed Care – PPO | Admitting: Women's Health

## 2014-02-09 ENCOUNTER — Encounter: Payer: Self-pay | Admitting: Women's Health

## 2014-02-09 ENCOUNTER — Ambulatory Visit (INDEPENDENT_AMBULATORY_CARE_PROVIDER_SITE_OTHER): Payer: BC Managed Care – PPO

## 2014-02-09 VITALS — BP 134/62 | Ht 64.0 in | Wt 227.0 lb

## 2014-02-09 DIAGNOSIS — D259 Leiomyoma of uterus, unspecified: Secondary | ICD-10-CM

## 2014-02-09 DIAGNOSIS — O26899 Other specified pregnancy related conditions, unspecified trimester: Secondary | ICD-10-CM

## 2014-02-09 DIAGNOSIS — O9989 Other specified diseases and conditions complicating pregnancy, childbirth and the puerperium: Secondary | ICD-10-CM

## 2014-02-09 DIAGNOSIS — R102 Pelvic and perineal pain: Secondary | ICD-10-CM | POA: Diagnosis not present

## 2014-02-09 DIAGNOSIS — R109 Unspecified abdominal pain: Secondary | ICD-10-CM | POA: Diagnosis not present

## 2014-02-09 DIAGNOSIS — O09521 Supervision of elderly multigravida, first trimester: Secondary | ICD-10-CM

## 2014-02-09 MED ORDER — CONCEPT DHA 53.5-38-1 MG PO CAPS: 1.0000 | ORAL_CAPSULE | Freq: Every day | ORAL | Status: DC

## 2014-02-09 NOTE — Patient Instructions (Addendum)
Tylenol as needed for the pain   Abdominal Pain During Pregnancy Abdominal pain is common in pregnancy. Most of the time, it does not cause harm. There are many causes of abdominal pain. Some causes are more serious than others. Some of the causes of abdominal pain in pregnancy are easily diagnosed. Occasionally, the diagnosis takes time to understand. Other times, the cause is not determined. Abdominal pain can be a sign that something is very wrong with the pregnancy, or the pain may have nothing to do with the pregnancy at all. For this reason, always tell your health care provider if you have any abdominal discomfort. HOME CARE INSTRUCTIONS  Monitor your abdominal pain for any changes. The following actions may help to alleviate any discomfort you are experiencing:  Do not have sexual intercourse or put anything in your vagina until your symptoms go away completely.  Get plenty of rest until your pain improves.  Drink clear fluids if you feel nauseous. Avoid solid food as long as you are uncomfortable or nauseous.  Only take over-the-counter or prescription medicine as directed by your health care provider.  Keep all follow-up appointments with your health care provider. SEEK IMMEDIATE MEDICAL CARE IF:  You are bleeding, leaking fluid, or passing tissue from the vagina.  You have increasing pain or cramping.  You have persistent vomiting.  You have painful or bloody urination.  You have a fever.  You notice a decrease in your baby's movements.  You have extreme weakness or feel faint.  You have shortness of breath, with or without abdominal pain.  You develop a severe headache with abdominal pain.  You have abnormal vaginal discharge with abdominal pain.  You have persistent diarrhea.  You have abdominal pain that continues even after rest, or gets worse. MAKE SURE YOU:   Understand these instructions.  Will watch your condition.  Will get help right away if you are  not doing well or get worse. Document Released: 03/20/2005 Document Revised: 01/08/2013 Document Reviewed: 10/17/2012 [facility] Patient Information 2015 [place], [place]. This information is not intended to replace advice given to you by your health care provider. Make sure you discuss any questions you have with your health care provider.  First Trimester of Pregnancy The first trimester of pregnancy is from week 1 until the end of week 12 (months 1 through 3). A week after a sperm fertilizes an egg, the egg will implant on the wall of the uterus. This embryo will begin to develop into a baby. Genes from you and your partner are forming the baby. The female genes determine whether the baby is a boy or a girl. At 6-8 weeks, the eyes and face are formed, and the heartbeat can be seen on ultrasound. At the end of 12 weeks, all the baby's organs are formed.  Now that you are pregnant, you will want to do everything you can to have a healthy baby. Two of the most important things are to get good prenatal care and to follow your health care provider's instructions. Prenatal care is all the medical care you receive before the baby's birth. This care will help prevent, find, and treat any problems during the pregnancy and childbirth. BODY CHANGES Your body goes through many changes during pregnancy. The changes vary from woman to woman.   You may gain or lose a couple of pounds at first.  You may feel sick to your stomach (nauseous) and throw up (vomit). If the vomiting is uncontrollable, call your  health care provider.  You may tire easily.  You may develop headaches that can be relieved by medicines approved by your health care provider.  You may urinate more often. Painful urination may mean you have a bladder infection.  You may develop heartburn as a result of your pregnancy.  You may develop constipation because certain hormones are causing the muscles that push waste through your intestines to slow  down.  You may develop hemorrhoids or swollen, bulging veins (varicose veins).  Your breasts may begin to grow larger and become tender. Your nipples may stick out more, and the tissue that surrounds them (areola) may become darker.  Your gums may bleed and may be sensitive to brushing and flossing.  Dark spots or blotches (chloasma, mask of pregnancy) may develop on your face. This will likely fade after the baby is born.  Your menstrual periods will stop.  You may have a loss of appetite.  You may develop cravings for certain kinds of food.  You may have changes in your emotions from day to day, such as being excited to be pregnant or being concerned that something may go wrong with the pregnancy and baby.  You may have more vivid and strange dreams.  You may have changes in your hair. These can include thickening of your hair, rapid growth, and changes in texture. Some women also have hair loss during or after pregnancy, or hair that feels dry or thin. Your hair will most likely return to normal after your baby is born. WHAT TO EXPECT AT YOUR PRENATAL VISITS During a routine prenatal visit:  You will be weighed to make sure you and the baby are growing normally.  Your blood pressure will be taken.  Your abdomen will be measured to track your baby's growth.  The fetal heartbeat will be listened to starting around week 10 or 12 of your pregnancy.  Test results from any previous visits will be discussed. Your health care provider may ask you:  How you are feeling.  If you are feeling the baby move.  If you have had any abnormal symptoms, such as leaking fluid, bleeding, severe headaches, or abdominal cramping.  If you have any questions. Other tests that may be performed during your first trimester include:  Blood tests to find your blood type and to check for the presence of any previous infections. They will also be used to check for low iron levels (anemia) and Rh  antibodies. Later in the pregnancy, blood tests for diabetes will be done along with other tests if problems develop.  Urine tests to check for infections, diabetes, or protein in the urine.  An ultrasound to confirm the proper growth and development of the baby.  An amniocentesis to check for possible genetic problems.  Fetal screens for spina bifida and Down syndrome.  You may need other tests to make sure you and the baby are doing well. HOME CARE INSTRUCTIONS  Medicines  Follow your health care provider's instructions regarding medicine use. Specific medicines may be either safe or unsafe to take during pregnancy.  Take your prenatal vitamins as directed.  If you develop constipation, try taking a stool softener if your health care provider approves. Diet  Eat regular, well-balanced meals. Choose a variety of foods, such as meat or vegetable-based protein, fish, milk and low-fat dairy products, vegetables, fruits, and whole grain breads and cereals. Your health care provider will help you determine the amount of weight gain that is right for  you.  Avoid raw meat and uncooked cheese. These carry germs that can cause birth defects in the baby.  Eating four or five small meals rather than three large meals a day may help relieve nausea and vomiting. If you start to feel nauseous, eating a few soda crackers can be helpful. Drinking liquids between meals instead of during meals also seems to help nausea and vomiting.  If you develop constipation, eat more high-fiber foods, such as fresh vegetables or fruit and whole grains. Drink enough fluids to keep your urine clear or pale yellow. Activity and Exercise  Exercise only as directed by your health care provider. Exercising will help you:  Control your weight.  Stay in shape.  Be prepared for labor and delivery.  Experiencing pain or cramping in the lower abdomen or low back is a good sign that you should stop exercising. Check  with your health care provider before continuing normal exercises.  Try to avoid standing for long periods of time. Move your legs often if you must stand in one place for a long time.  Avoid heavy lifting.  Wear low-heeled shoes, and practice good posture.  You may continue to have sex unless your health care provider directs you otherwise. Relief of Pain or Discomfort  Wear a good support bra for breast tenderness.   Take warm sitz baths to soothe any pain or discomfort caused by hemorrhoids. Use hemorrhoid cream if your health care provider approves.   Rest with your legs elevated if you have leg cramps or low back pain.  If you develop varicose veins in your legs, wear support hose. Elevate your feet for 15 minutes, 3-4 times a day. Limit salt in your diet. Prenatal Care  Schedule your prenatal visits by the twelfth week of pregnancy. They are usually scheduled monthly at first, then more often in the last 2 months before delivery.  Write down your questions. Take them to your prenatal visits.  Keep all your prenatal visits as directed by your health care provider. Safety  Wear your seat belt at all times when driving.  Make a list of emergency phone numbers, including numbers for family, friends, the hospital, and police and fire departments. General Tips  Ask your health care provider for a referral to a local prenatal education class. Begin classes no later than at the beginning of month 6 of your pregnancy.  Ask for help if you have counseling or nutritional needs during pregnancy. Your health care provider can offer advice or refer you to specialists for help with various needs.  Do not use hot tubs, steam rooms, or saunas.  Do not douche or use tampons or scented sanitary pads.  Do not cross your legs for long periods of time.  Avoid cat litter boxes and soil used by cats. These carry germs that can cause birth defects in the baby and possibly loss of the fetus  by miscarriage or stillbirth.  Avoid all smoking, herbs, alcohol, and medicines not prescribed by your health care provider. Chemicals in these affect the formation and growth of the baby.  Schedule a dentist appointment. At home, brush your teeth with a soft toothbrush and be gentle when you floss. SEEK MEDICAL CARE IF:   You have dizziness.  You have mild pelvic cramps, pelvic pressure, or nagging pain in the abdominal area.  You have persistent nausea, vomiting, or diarrhea.  You have a bad smelling vaginal discharge.  You have pain with urination.  You notice increased  swelling in your face, hands, legs, or ankles. SEEK IMMEDIATE MEDICAL CARE IF:   You have a fever.  You are leaking fluid from your vagina.  You have spotting or bleeding from your vagina.  You have severe abdominal cramping or pain.  You have rapid weight gain or loss.  You vomit blood or material that looks like coffee grounds.  You are exposed to [place] measles and have never had them.  You are exposed to fifth disease or chickenpox.  You develop a severe headache.  You have shortness of breath.  You have any kind of trauma, such as from a fall or a car accident. Document Released: 03/14/2001 Document Revised: 08/04/2013 Document Reviewed: 01/28/2013 [facility] Patient Information 2015 [place], [place]. This information is not intended to replace advice given to you by your health care provider. Make sure you discuss any questions you have with your health care provider.

## 2014-02-09 NOTE — Progress Notes (Signed)
U/S-retroverted uterus noted with GS and +YS noted within, GS meas c/w ~6+WKS, YS=3.16mm, no fetal pole noted on today's exam, cx appears closed, fundal fibroid noted = 28 x 8mm, bilateral adnexa appears WNL unable to clearly identify Lt ovary, no free fluid noted within the pelvis, Maternal Lt Kidney checked- no hydronephrosis noted, would like to reck for confirmation of viability and dates

## 2014-02-09 NOTE — Progress Notes (Signed)
Patient ID: [name], female   DOB: 1981/02/07, 33 y.o.   MRN: [id number]   [facility] Visit  Patient name: MONROE TOURE MRN [id number]  Date of birth: 1980/06/30  CC & HPI:  ROMEY COHEA is a 33 y.o. African American G2P1001 female at [redacted]w[redacted]d by LMP presenting today for f/u after ED visit on 11/7 for intermittent sharp Lt flank pain radiating to Lt abd that began 11/7. Denies uti s/s, vb, cramping, vag d/c/odor/itching/irritation. Found out about pregnancy at ED, has not started pnv. Had neg UA there. No nausea.  Pain not currently present.   Pertinent History Reviewed:  Medical & Surgical Hx:   History reviewed. No pertinent past medical history. History reviewed. No pertinent past surgical history. Medications: Reviewed & Updated - see associated section Social History: Reviewed -  reports that she has never smoked. She does not have any smokeless tobacco history on file.  Objective Findings:  Vitals: BP 134/62 mmHg  Ht 5\' 4"  (1.626 m)  Wt 227 lb (102.967 kg)  BMI 38.95 kg/m2  LMP 01/07/2014 (Approximate)  Physical Examination: General appearance - alert, well appearing, and in no distress No CVAT   UA dipstick: neg  Today's u/s: retroverted uterus noted with GS and +YS noted within, GS meas c/w ~6+WKS, YS=3.49mm, no fetal pole noted on today's exam, cx appears closed, fundal fibroid noted = 28 x 60mm, bilateral adnexa appears WNL unable to clearly identify Lt ovary, no free fluid noted within the pelvis, Maternal Lt Kidney checked- no hydronephrosis noted, would like to reck for confirmation of viability and dates  Assessment & Plan:  A:   [redacted]w[redacted]d by LMP, [redacted]w[redacted]d by GS w/ +YS no fetal pole yet, no concern for ectopic  Lt flank/abd pain w/ normal kidney by u/s, normal ua  Small fundal fibroid P:  Rx pnv to begin  APAP as needed for pain  F/U 1wk for repeat u/s to confirm dates/viability and new ob visit  bHCG today  Call/seek care if pain worsens, or  develops cramping/vb   Tawnya Crook CNM, [facility] 02/09/2014 12:03 PM

## 2014-02-10 LAB — HCG, QUANTITATIVE, PREGNANCY: HCG, BETA CHAIN, QUANT, S: 14949.9 m[IU]/mL

## 2014-02-15 ENCOUNTER — Encounter (HOSPITAL_COMMUNITY): Payer: Self-pay

## 2014-02-15 ENCOUNTER — Emergency Department (HOSPITAL_COMMUNITY)
Admission: EM | Admit: 2014-02-15 | Discharge: 2014-02-16 | Disposition: A | Discharge status: Home / Self Care | Payer: BC Managed Care – PPO | Attending: Emergency Medicine | Admitting: Emergency Medicine

## 2014-02-15 DIAGNOSIS — Z3A01 Less than 8 weeks gestation of pregnancy: Secondary | ICD-10-CM | POA: Insufficient documentation

## 2014-02-15 DIAGNOSIS — O4691 Antepartum hemorrhage, unspecified, first trimester: Secondary | ICD-10-CM | POA: Diagnosis present

## 2014-02-15 DIAGNOSIS — Z79899 Other long term (current) drug therapy: Secondary | ICD-10-CM | POA: Insufficient documentation

## 2014-02-15 DIAGNOSIS — O209 Hemorrhage in early pregnancy, unspecified: Secondary | ICD-10-CM

## 2014-02-15 LAB — CBC WITH DIFFERENTIAL/PLATELET
BASOS PCT: 0 % (ref 0–1)
Basophils Absolute: 0 10*3/uL (ref 0.0–0.1)
EOS ABS: 0.2 10*3/uL (ref 0.0–0.7)
Eosinophils Relative: 2 % (ref 0–5)
HCT: 32.2 % — ABNORMAL LOW (ref 36.0–46.0)
Hemoglobin: 10.3 g/dL — ABNORMAL LOW (ref 12.0–15.0)
LYMPHS ABS: 3.6 10*3/uL (ref 0.7–4.0)
Lymphocytes Relative: 29 % (ref 12–46)
MCH: 23.3 pg — AB (ref 26.0–34.0)
MCHC: 32 g/dL (ref 30.0–36.0)
MCV: 72.9 fL — ABNORMAL LOW (ref 78.0–100.0)
Monocytes Absolute: 0.9 10*3/uL (ref 0.1–1.0)
Monocytes Relative: 7 % (ref 3–12)
NEUTROS ABS: 7.9 10*3/uL — AB (ref 1.7–7.7)
NEUTROS PCT: 62 % (ref 43–77)
PLATELETS: 250 10*3/uL (ref 150–400)
RBC: 4.42 MIL/uL (ref 3.87–5.11)
RDW: 17.4 % — ABNORMAL HIGH (ref 11.5–15.5)
WBC: 12.6 10*3/uL — ABNORMAL HIGH (ref 4.0–10.5)

## 2014-02-15 LAB — ABO/RH: ABO/RH(D): A POS

## 2014-02-15 NOTE — ED Provider Notes (Signed)
CSN: [id number]     Arrival date & time 02/15/14  2144 History   This chart was scribed for Delora Fuel, MD by Randa Evens, ED Scribe. This patient was seen in room APA08/APA08 and the patient's care was started at 11:15 PM.   Chief Complaint  Patient presents with  . Vaginal Bleeding   Patient is a 33 y.o. female presenting with vaginal bleeding. The history is provided by the patient. No language interpreter was used.  Vaginal Bleeding Associated symptoms: no abdominal pain    HPI Comments: NEVE BRANSCOMB is a 32 y.o. female who presents to the Emergency Department complaining of progressively worsening vaginal bleeding onset 1-2 weeks ago. That recently got worse today when she noticed the bleeding start to get heavier. She states she noticed bleeding shortly after taking pre natal vitamins with iron. She states she uses about 1 pad per day. She denies passing any clots or abdominal cramping. She denies any other complications with pregnancy.   LMP 01/06/2014 G2 P1 Ab0  History reviewed. No pertinent past medical history. History reviewed. No pertinent past surgical history. No family history on file. History  Substance Use Topics  . Smoking status: Never Smoker   . Smokeless tobacco: Not on file  . Alcohol Use: No   OB History    Gravida Para Term Preterm AB TAB SAB Ectopic Multiple Living   2 1 1       1      Review of Systems  Gastrointestinal: Negative for abdominal pain.  Genitourinary: Positive for vaginal bleeding.  All other systems reviewed and are negative.   Allergies  Review of patient's allergies indicates no known allergies.  Home Medications   Prior to Admission medications   Medication Sig Start Date End Date Taking? Authorizing Provider  acetaminophen (TYLENOL) 500 MG tablet Take 1,000 mg by mouth every 6 (six) hours as needed. Pain   Yes Historical Provider, MD  Prenat-FeFum-FePo-FA-Omega 3 (CONCEPT DHA) 53.5-38-1 MG CAPS Take 1 capsule by mouth  daily. 02/09/14  Yes Tawnya Crook, CNM  Dextromethorphan-Guaifenesin [facility] DM) 30-600 MG TB12 Take 1 tablet by mouth every 12 (twelve) hours. Patient not taking: Reported on 02/07/2014 04/22/12   Fredia Sorrow, MD  naproxen (NAPROSYN) 500 MG tablet Take 1 tablet (500 mg total) by mouth 2 (two) times daily. Patient not taking: Reported on 02/07/2014 04/22/12   Fredia Sorrow, MD  Prenatal Vit-Fe Fumarate-FA (PRENATAL COMPLETE) 14-0.4 MG TABS Take 1 tablet by mouth daily. Patient not taking: Reported on 02/15/2014 02/07/14   Orpah Greek, MD   Triage Vitals: BP 119/64 mmHg  Pulse 81  Temp(Src) 98 F (36.7 C) (Oral)  Resp 20  Ht 5\' 4"  (1.626 m)  Wt 232 lb 3.2 oz (105.325 kg)  BMI 39.84 kg/m2  SpO2 100%  LMP 01/07/2014 (Approximate)  Physical Exam  Constitutional: She is oriented to person, place, and time. She appears well-developed and well-nourished. No distress.  HENT:  Head: Normocephalic and atraumatic.  Eyes: Conjunctivae and EOM are normal. Pupils are equal, round, and reactive to light.  Neck: Normal range of motion. Neck supple. No JVD present. No tracheal deviation present.  Cardiovascular: Normal rate and normal heart sounds.   No murmur heard. Pulmonary/Chest: Effort normal and breath sounds normal. No respiratory distress. She has no wheezes. She has no rales.  Abdominal: Soft. Bowel sounds are normal. She exhibits no distension and no mass. There is no tenderness.  Genitourinary:  Normal external female genitalia. Small amount  of blood present in vaginal vault. Cervix is closed. No adnexal masses or tenderness. No cervical motion tenderness. Fundal size difficult to assess due to patient's body habitus.  Musculoskeletal: Normal range of motion. She exhibits no edema.  Lymphadenopathy:    She has no cervical adenopathy.  Neurological: She is alert and oriented to person, place, and time. She has normal reflexes. No cranial nerve deficit. Coordination  normal.  Skin: Skin is warm and dry. No rash noted.  Psychiatric: She has a normal mood and affect. Her behavior is normal. Thought content normal.  Nursing note and vitals reviewed.   ED Course  Procedures (including critical care time) DIAGNOSTIC STUDIES: Oxygen Saturation is 100% on RA, normal by my interpretation.    COORDINATION OF CARE: 11:19 PM-Discussed treatment plan which includes blood work up and pelvic exam with pt at bedside and pt agreed to plan.    Labs Review Results for orders placed or performed during the hospital encounter of 02/15/14  Wet prep, genital  Result Value Ref Range   Yeast Wet Prep HPF POC NONE SEEN NONE SEEN   Trich, Wet Prep NONE SEEN NONE SEEN   Clue Cells Wet Prep HPF POC MANY (A) NONE SEEN   WBC, Wet Prep HPF POC FEW (A) NONE SEEN  CBC with Differential  Result Value Ref Range   WBC 12.6 (H) 4.0 - 10.5 K/uL   RBC 4.42 3.87 - 5.11 MIL/uL   Hemoglobin 10.3 (L) 12.0 - 15.0 g/dL   HCT 32.2 (L) 36.0 - 46.0 %   MCV 72.9 (L) 78.0 - 100.0 fL   MCH 23.3 (L) 26.0 - 34.0 pg   MCHC 32.0 30.0 - 36.0 g/dL   RDW 17.4 (H) 11.5 - 15.5 %   Platelets 250 150 - 400 K/uL   Neutrophils Relative % 62 43 - 77 %   Neutro Abs 7.9 (H) 1.7 - 7.7 K/uL   Lymphocytes Relative 29 12 - 46 %   Lymphs Abs 3.6 0.7 - 4.0 K/uL   Monocytes Relative 7 3 - 12 %   Monocytes Absolute 0.9 0.1 - 1.0 K/uL   Eosinophils Relative 2 0 - 5 %   Eosinophils Absolute 0.2 0.0 - 0.7 K/uL   Basophils Relative 0 0 - 1 %   Basophils Absolute 0.0 0.0 - 0.1 K/uL  ABO/Rh  Result Value Ref Range   ABO/RH(D) A POS    US Ob Transvaginal  02/09/2014   DATING AND VIABILITY SONOGRAM   NAYANNA SEABORN is a 33 y.o. year old G2P1001 with LMP 01/07/2014 which  would correlate to  4+[redacted] weeks gestation.  She has regular menstrual  cycles.   She is here today for a confirmatory initial sonogram.    GESTATION: SINGLETON   Single Intrauterine Gestational Sac with +YS noted within  retroverted uterus  FETAL  ACTIVITY:          Heart rate         No Fetal pole noted on today's exam            CERVIX: Appears closed   ADNEXA: The adnexa appears  normal.  Fundal Fibroid noted = 28 x 27 mm  GESTATIONAL AGE AND  BIOMETRICS:  Gestational criteria: Estimated Date of Delivery: 10/14/2014 by LMP now at  4+5wks  Previous Scans:0  GESTATIONAL SAC           13.6 mm         6+2 weeks  CROWN RUMP LENGTH  mm          weeks                                                                               AVERAGE EGA(BY THIS SCAN):   ~6+2 weeks  WORKING EDD( early ultrasound ):  ~10/03/2014 will confirm with +FCA and CRl  measurement   TECHNICIAN COMMENTS:  U/S-retroverted uterus noted with GS and +YS noted within, GS meas c/w  ~6+WKS, YS=3.33mm, no fetal pole noted on today's exam, cx appears closed,  fundal fibroid noted = 28 x 34mm, bilateral adnexa appears WNL unable to  clearly identify Lt ovary, no free fluid noted within the pelvis, Maternal  Lt Kidney checked- no hydronephrosis noted, would like to reck for  confirmation of viability and dates        A copy of this report including all images has been saved and backed up to  a second source for retrieval if needed. All measures and details of the  anatomical scan, placentation, fluid volume and pelvic anatomy are  contained in that report.  Lazarus Gowda 02/09/2014 11:16 AM    MDM   Final diagnoses:  First trimester bleeding       First trimester bleeding. The fact that this has been stable for a week is relatively reassuring. No active bleeding seen on pelvic exam. Recent ultrasound confirmed presence of IUP. Nothing to suggest heterotopic pregnancy. She will be referred back to her obstetrician for follow-up in 2-3 days.  Blood type is A+, so she does not need program. HCG level has increased over values from 6 days ago.  I personally performed the services described in this documentation, which was scribed in my presence. The recorded information has been  reviewed and is accurate.       Delora Fuel, MD [id number]

## 2014-02-15 NOTE — ED Notes (Addendum)
Pt is [redacted] weeks pregnant and has had vaginal bleeding x1week. Bleeding started to become heavier around 60pm.No pain or cramping with bleeding. Pt states she has only used 1 pad a day for the bleeding.  She states the bleeding started after she began taking new pre-natal vitamins.

## 2014-02-15 NOTE — ED Notes (Signed)
Patient states she is [redacted] weeks pregnant and tonight she is having vaginal bleeding. Patient denies pain or trauma.

## 2014-02-16 ENCOUNTER — Other Ambulatory Visit: Payer: Self-pay | Admitting: Obstetrics and Gynecology

## 2014-02-16 DIAGNOSIS — O3680X0 Pregnancy with inconclusive fetal viability, not applicable or unspecified: Secondary | ICD-10-CM

## 2014-02-16 LAB — WET PREP, GENITAL
Trich, Wet Prep: NONE SEEN
Yeast Wet Prep HPF POC: NONE SEEN

## 2014-02-16 LAB — HCG, QUANTITATIVE, PREGNANCY: HCG, BETA CHAIN, QUANT, S: 42990 m[IU]/mL — AB (ref <5)

## 2014-02-16 LAB — RPR

## 2014-02-16 LAB — HIV ANTIBODY (ROUTINE TESTING W REFLEX): HIV: NONREACTIVE

## 2014-02-16 NOTE — ED Notes (Signed)
Discharge instructions given, pt demonstrated teach back and verbal understanding. No concerns voiced.  

## 2014-02-16 NOTE — Discharge Instructions (Signed)
Vaginal Bleeding During Pregnancy, First Trimester  A small amount of bleeding (spotting) from the vagina is relatively common in early pregnancy. It usually stops on its own. Various things may cause bleeding or spotting in early pregnancy. Some bleeding may be related to the pregnancy, and some may not. In most cases, the bleeding is normal and is not a problem. However, bleeding can also be a sign of something serious. Be sure to tell your health care provider about any vaginal bleeding right away.  Some possible causes of vaginal bleeding during the first trimester include:  · Infection or inflammation of the cervix.  · Growths (polyps) on the cervix.  · Miscarriage or threatened miscarriage.  · Pregnancy tissue has developed outside of the uterus and in a fallopian tube (tubal pregnancy).  · Tiny cysts have developed in the uterus instead of pregnancy tissue (molar pregnancy).  HOME CARE INSTRUCTIONS   Watch your condition for any changes. The following actions may help to lessen any discomfort you are feeling:  · Follow your health care provider's instructions for limiting your activity. If your health care provider orders bed rest, you may need to stay in bed and only get up to use the bathroom. However, your health care provider may allow you to continue light activity.  · If needed, make plans for someone to help with your regular activities and responsibilities while you are on bed rest.  · Keep track of the number of pads you use each day, how often you change pads, and how soaked (saturated) they are. Write this down.  · Do not use tampons. Do not douche.  · Do not have sexual intercourse or orgasms until approved by your health care provider.  · If you pass any tissue from your vagina, save the tissue so you can show it to your health care provider.  · Only take over-the-counter or prescription medicines as directed by your health care provider.  · Do not take aspirin because it can make you  bleed.  · Keep all follow-up appointments as directed by your health care provider.  SEEK MEDICAL CARE IF:  · You have any vaginal bleeding during any part of your pregnancy.  · You have cramps or labor pains.  · You have a fever, not controlled by medicine.  SEEK IMMEDIATE MEDICAL CARE IF:   · You have severe cramps in your back or belly (abdomen).  · You pass large clots or tissue from your vagina.  · Your bleeding increases.  · You feel light-headed or weak, or you have fainting episodes.  · You have chills.  · You are leaking fluid or have a gush of fluid from your vagina.  · You pass out while having a bowel movement.  MAKE SURE YOU:  · Understand these instructions.  · Will watch your condition.  · Will get help right away if you are not doing well or get worse.  Document Released: 12/28/2004 Document Revised: 03/25/2013 Document Reviewed: 11/25/2012  [employer]® Patient Information ©2015 [employer]. This information is not intended to replace advice given to you by your health care provider. Make sure you discuss any questions you have with your health care provider.

## 2014-02-17 LAB — GC/CHLAMYDIA PROBE AMP
CT PROBE, AMP APTIMA: POSITIVE — AB
GC Probe RNA: NEGATIVE

## 2014-02-18 ENCOUNTER — Telehealth (HOSPITAL_BASED_OUTPATIENT_CLINIC_OR_DEPARTMENT_OTHER): Payer: Self-pay | Admitting: Emergency Medicine

## 2014-02-18 ENCOUNTER — Ambulatory Visit: Payer: BC Managed Care – PPO

## 2014-02-18 NOTE — Telephone Encounter (Signed)
+   chlamydia, not treated,patient is pregnant, handoff to EDP 02/18/14

## 2014-02-19 ENCOUNTER — Encounter: Payer: BC Managed Care – PPO | Admitting: Adult Health

## 2014-02-20 ENCOUNTER — Telehealth: Payer: Self-pay | Admitting: Emergency Medicine

## 2014-03-04 ENCOUNTER — Other Ambulatory Visit: Payer: BC Managed Care – PPO

## 2014-03-06 ENCOUNTER — Telehealth (HOSPITAL_COMMUNITY): Payer: Self-pay

## 2014-03-06 NOTE — Telephone Encounter (Signed)
Unable to reach patient by telephone or mail. No follow-up or treatment changes made. Positive for chlamydia. Treatment needed.

## 2014-03-08 ENCOUNTER — Emergency Department (HOSPITAL_COMMUNITY)
Admission: EM | Admit: 2014-03-08 | Discharge: 2014-03-08 | Disposition: A | Discharge status: Home / Self Care | Payer: BC Managed Care – PPO | Attending: Emergency Medicine | Admitting: Emergency Medicine

## 2014-03-08 ENCOUNTER — Encounter (HOSPITAL_COMMUNITY): Payer: Self-pay | Admitting: Emergency Medicine

## 2014-03-08 DIAGNOSIS — Z79899 Other long term (current) drug therapy: Secondary | ICD-10-CM | POA: Diagnosis not present

## 2014-03-08 DIAGNOSIS — O209 Hemorrhage in early pregnancy, unspecified: Secondary | ICD-10-CM

## 2014-03-08 DIAGNOSIS — Z3A08 8 weeks gestation of pregnancy: Secondary | ICD-10-CM | POA: Insufficient documentation

## 2014-03-08 DIAGNOSIS — O200 Threatened abortion: Secondary | ICD-10-CM | POA: Diagnosis not present

## 2014-03-08 LAB — URINALYSIS, ROUTINE W REFLEX MICROSCOPIC
Bilirubin Urine: NEGATIVE
Glucose, UA: NEGATIVE mg/dL
Hgb urine dipstick: NEGATIVE
Ketones, ur: 15 mg/dL — AB
NITRITE: NEGATIVE
Protein, ur: NEGATIVE mg/dL
SPECIFIC GRAVITY, URINE: 1.015 (ref 1.005–1.030)
UROBILINOGEN UA: 0.2 mg/dL (ref 0.0–1.0)
pH: 7.5 (ref 5.0–8.0)

## 2014-03-08 LAB — CBC WITH DIFFERENTIAL/PLATELET
Basophils Absolute: 0 10*3/uL (ref 0.0–0.1)
Basophils Relative: 0 % (ref 0–1)
Eosinophils Absolute: 0.1 10*3/uL (ref 0.0–0.7)
Eosinophils Relative: 1 % (ref 0–5)
HCT: 31.2 % — ABNORMAL LOW (ref 36.0–46.0)
Hemoglobin: 10.2 g/dL — ABNORMAL LOW (ref 12.0–15.0)
LYMPHS ABS: 2.4 10*3/uL (ref 0.7–4.0)
LYMPHS PCT: 19 % (ref 12–46)
MCH: 23.5 pg — ABNORMAL LOW (ref 26.0–34.0)
MCHC: 32.7 g/dL (ref 30.0–36.0)
MCV: 71.9 fL — ABNORMAL LOW (ref 78.0–100.0)
Monocytes Absolute: 0.7 10*3/uL (ref 0.1–1.0)
Monocytes Relative: 6 % (ref 3–12)
NEUTROS ABS: 9.3 10*3/uL — AB (ref 1.7–7.7)
NEUTROS PCT: 74 % (ref 43–77)
PLATELETS: 251 10*3/uL (ref 150–400)
RBC: 4.34 MIL/uL (ref 3.87–5.11)
RDW: 19.1 % — ABNORMAL HIGH (ref 11.5–15.5)
WBC: 12.5 10*3/uL — AB (ref 4.0–10.5)

## 2014-03-08 LAB — URINE MICROSCOPIC-ADD ON

## 2014-03-08 LAB — HCG, QUANTITATIVE, PREGNANCY: HCG, BETA CHAIN, QUANT, S: 160134 m[IU]/mL — AB (ref <5)

## 2014-03-08 MED ORDER — SODIUM CHLORIDE 0.9 % IV BOLUS (SEPSIS)
1000.0000 mL | Freq: Once | INTRAVENOUS | Status: AC
  Administered 2014-03-08: 1000 mL via INTRAVENOUS

## 2014-03-08 MED ORDER — AZITHROMYCIN 250 MG PO TABS
1000.0000 mg | ORAL_TABLET | Freq: Once | ORAL | Status: AC
  Administered 2014-03-08: 1000 mg via ORAL
  Filled 2014-03-08: qty 4

## 2014-03-08 NOTE — ED Provider Notes (Signed)
CSN: [id number]     Arrival date & time 03/08/14  4401 History  This chart was scribed for Shaune Pollack, MD by Jeanell Sparrow, ED Scribe. This patient was seen in room APA19/APA19 and the patient's care was started at 7:48 AM.   Chief Complaint  Patient presents with  . Vaginal Bleeding   Patient is a 33 y.o. female presenting with vaginal bleeding. The history is provided by the patient. No language interpreter was used.  Vaginal Bleeding Severity:  Moderate Onset quality:  Sudden Duration:  5 hours Timing:  Constant Progression:  Unchanged Chronicity:  New Number of pads used:  0 Possible pregnancy: yes   Context: spontaneously   Relieved by:  None tried Worsened by:  Nothing tried Ineffective treatments:  None tried Associated symptoms: no abdominal pain    HPI Comments: Alexandra Mclaughlin is a 33 y.o. female who presents to the Emergency Department complaining of vaginal bleeding that started about 4 hours ago. She states that this morning she noticed blood ran down her legs and went to the bathroom and her vaginal bleeding keep persisting. She denies any pain. She reports that she is currently [redacted] weeks pregnant. She states that this is her second pregnancy, with her 1st on being successful. She reports that her LNMP was October 7th. She reports no hx of alcohol use or smoking. She denies any appetite change or abdominal pain.   History reviewed. No pertinent past medical history. Past Surgical History  Procedure Laterality Date  . Cesarean section     History reviewed. No pertinent family history. History  Substance Use Topics  . Smoking status: Never Smoker   . Smokeless tobacco: Not on file  . Alcohol Use: No   OB History    Gravida Para Term Preterm AB TAB SAB Ectopic Multiple Living   2 1 1       1      Review of Systems  Constitutional: Negative for appetite change.  Gastrointestinal: Negative for abdominal pain.  Genitourinary: Positive for vaginal bleeding.  All  other systems reviewed and are negative.   Allergies  Review of patient's allergies indicates no known allergies.  Home Medications   Prior to Admission medications   Medication Sig Start Date End Date Taking? Authorizing Provider  acetaminophen (TYLENOL) 500 MG tablet Take 1,000 mg by mouth every 6 (six) hours as needed. Pain    Historical Provider, MD  Dextromethorphan-Guaifenesin (MUCINEX DM) 30-600 MG TB12 Take 1 tablet by mouth every 12 (twelve) hours. Patient not taking: Reported on 02/07/2014 04/22/12   Fredia Sorrow, MD  naproxen (NAPROSYN) 500 MG tablet Take 1 tablet (500 mg total) by mouth 2 (two) times daily. Patient not taking: Reported on 02/07/2014 04/22/12   Fredia Sorrow, MD  Prenat-FeFum-FePo-FA-Omega 3 (CONCEPT DHA) 53.5-38-1 MG CAPS Take 1 capsule by mouth daily. 02/09/14   Tawnya Crook, CNM  Prenatal Vit-Fe Fumarate-FA (PRENATAL COMPLETE) 14-0.4 MG TABS Take 1 tablet by mouth daily. Patient not taking: Reported on 02/15/2014 02/07/14   Orpah Greek, MD   BP 123/66 mmHg  Pulse 89  Temp(Src) 98.2 F (36.8 C) (Oral)  Resp 18  Ht 5' 4.5" (1.638 m)  Wt 232 lb (105.235 kg)  BMI 39.22 kg/m2  SpO2 99%  LMP 01/07/2014 (Approximate) Physical Exam  Constitutional: She is oriented to person, place, and time. She appears well-developed and well-nourished. No distress.  HENT:  Head: Normocephalic and atraumatic.  Neck: Neck supple. No tracheal deviation present.  Cardiovascular:  Normal rate.   Pulmonary/Chest: Effort normal. No respiratory distress.  Genitourinary: Cervix exhibits discharge.  Blood in vault   Musculoskeletal: Normal range of motion.  Neurological: She is alert and oriented to person, place, and time.  Skin: Skin is warm and dry.  Psychiatric: She has a normal mood and affect. Her behavior is normal.  Nursing note and vitals reviewed.   ED Course  [place] bedside Date/Time: 03/08/2014 11:11 AM Performed by: Shaune Pollack Authorized  by: Shaune Pollack Consent: Verbal consent not obtained. Patient identity confirmed: verbally with patient Local anesthesia used: no Patient sedated: no Comments: iup at 8 w 3 d.    (including critical care time) DIAGNOSTIC STUDIES: Oxygen Saturation is 99% on RA, normal by my interpretation.    COORDINATION OF CARE: 7:52 AM- Pt advised of plan for treatment which includes medication and labs and pt agrees.  Labs Review Labs Reviewed  URINALYSIS, ROUTINE W REFLEX MICROSCOPIC - Abnormal; Notable for the following:    Ketones, ur 15 (*)    Leukocytes, UA TRACE (*)    All other components within normal limits  CBC WITH DIFFERENTIAL - Abnormal; Notable for the following:    WBC 12.5 (*)    Hemoglobin 10.2 (*)    HCT 31.2 (*)    MCV 71.9 (*)    MCH 23.5 (*)    RDW 19.1 (*)    Neutro Abs 9.3 (*)    All other components within normal limits  HCG, QUANTITATIVE, PREGNANCY - Abnormal; Notable for the following:    hCG, Beta Chain, [place], [place] [id number] (*)    All other components within normal limits  URINE MICROSCOPIC-ADD ON - Abnormal; Notable for the following:    Squamous Epithelial / LPF FEW (*)    All other components within normal limits  URINE CULTURE    Imaging Review No results found.   EKG Interpretation None      MDM   Final diagnoses:  Threatened abortion in first trimester  Vaginal bleeding before [redacted] weeks gestation   Ultrasound obtained at bedtime with archive pictures reveals cardiac activity and by measurement is at 8 weeks 3 days consistent with the patient's office ultrasound. She's continued to have some vaginal bleeding. My pelvic exam I was unable to visualize the cervix. Bimanual exam is normal with cervix appearing). I discussed miscarriage precautions with the patient and need for close follow-up with her gynecologist and she voices understanding. I personally performed the services described in this documentation, which was scribed in my presence. The  recorded information has been reviewed and considered. Patient had a positive chlamydia test from the last time she was seen and by her history was not treated. She received 1 g of Zithromax here in the emergency department. I discussed with the patient return precautions and need for follow-up and she voices understanding.   I personally performed the services described in this documentation, which was scribed in my presence. The recorded information has been reviewed and considered.   Shaune Pollack, MD 03/08/14 [id number]

## 2014-03-08 NOTE — Discharge Instructions (Signed)
Vaginal Bleeding During Pregnancy, First Trimester A small amount of bleeding (spotting) from the vagina is relatively common in early pregnancy. It usually stops on its own. Various things may cause bleeding or spotting in early pregnancy. Some bleeding may be related to the pregnancy, and some may not. In most cases, the bleeding is normal and is not a problem. However, bleeding can also be a sign of something serious. Be sure to tell your health care provider about any vaginal bleeding right away. Some possible causes of vaginal bleeding during the first trimester include:  Infection or inflammation of the cervix.  Growths (polyps) on the cervix.  Miscarriage or threatened miscarriage.  Pregnancy tissue has developed outside of the uterus and in a fallopian tube (tubal pregnancy).  Tiny cysts have developed in the uterus instead of pregnancy tissue (molar pregnancy). HOME CARE INSTRUCTIONS  Watch your condition for any changes. The following actions may help to lessen any discomfort you are feeling:  Follow your health care provider's instructions for limiting your activity. If your health care provider orders bed rest, you may need to stay in bed and only get up to use the bathroom. However, your health care provider may allow you to continue light activity.  If needed, make plans for someone to help with your regular activities and responsibilities while you are on bed rest.  Keep track of the number of pads you use each day, how often you change pads, and how soaked (saturated) they are. Write this down.  Do not use tampons. Do not douche.  Do not have sexual intercourse or orgasms until approved by your health care provider.  If you pass any tissue from your vagina, save the tissue so you can show it to your health care provider.  Only take over-the-counter or prescription medicines as directed by your health care provider.  Do not take aspirin because it can make you  bleed.  Keep all follow-up appointments as directed by your health care provider. SEEK MEDICAL CARE IF:  You have any vaginal bleeding during any part of your pregnancy.  You have cramps or labor pains.  You have a fever, not controlled by medicine. SEEK IMMEDIATE MEDICAL CARE IF:   You have severe cramps in your back or belly (abdomen).  You pass large clots or tissue from your vagina.  Your bleeding increases.  You feel light-headed or weak, or you have fainting episodes.  You have chills.  You are leaking fluid or have a gush of fluid from your vagina.  You pass out while having a bowel movement. MAKE SURE YOU:  Understand these instructions.  Will watch your condition.  Will get help right away if you are not doing well or get worse. Document Released: 12/28/2004 Document Revised: 03/25/2013 Document Reviewed: 11/25/2012 [facility] Patient Information 2015 [place], [place]. This information is not intended to replace advice given to you by your health care provider. Make sure you discuss any questions you have with your health care provider.  Threatened Miscarriage A threatened miscarriage is when you have vaginal bleeding during your first 20 weeks of pregnancy but the pregnancy has not ended. Your doctor will do tests to make sure you are still pregnant. The cause of the bleeding may not be known. This condition does not mean your pregnancy will end. It does increase the risk of it ending (complete miscarriage). HOME CARE   Make sure you keep all your doctor visits for prenatal care.  Get plenty of rest.  Do not have sex or use tampons if you have vaginal bleeding.  Do not douche.  Do not smoke or use drugs.  Do not drink alcohol.  Avoid caffeine. GET HELP IF:  You have light bleeding from your vagina.  You have belly pain or cramping.  You have a fever. GET HELP RIGHT AWAY IF:   You have heavy bleeding from your vagina.  You have clots of blood  coming from your vagina.  You have bad pain or cramps in your low back or belly.  You have fever, chills, and bad belly pain. MAKE SURE YOU:   Understand these instructions.  Will watch your condition.  Will get help right away if you are not doing well or get worse. Document Released: 03/02/2008 Document Revised: 03/25/2013 Document Reviewed: 01/14/2013 [facility] Patient Information 2015 [place], [place]. This information is not intended to replace advice given to you by your health care provider. Make sure you discuss any questions you have with your health care provider.

## 2014-03-08 NOTE — ED Notes (Signed)
Pt reports is approxiamately [redacted] weeks pregnant and reports vaginal bleeding since 4am this am. Pt reports is lightheaded at times but denies any abdominal pain.

## 2014-03-09 LAB — URINE CULTURE
CULTURE: NO GROWTH
Colony Count: NO GROWTH

## 2014-03-10 ENCOUNTER — Other Ambulatory Visit: Payer: Self-pay | Admitting: Obstetrics and Gynecology

## 2014-03-10 ENCOUNTER — Encounter: Payer: Self-pay | Admitting: Women's Health

## 2014-03-10 ENCOUNTER — Ambulatory Visit (INDEPENDENT_AMBULATORY_CARE_PROVIDER_SITE_OTHER): Payer: BC Managed Care – PPO

## 2014-03-10 ENCOUNTER — Ambulatory Visit (INDEPENDENT_AMBULATORY_CARE_PROVIDER_SITE_OTHER): Payer: BC Managed Care – PPO | Admitting: Women's Health

## 2014-03-10 VITALS — BP 126/66 | Wt 233.0 lb

## 2014-03-10 DIAGNOSIS — Z114 Encounter for screening for human immunodeficiency virus [HIV]: Secondary | ICD-10-CM

## 2014-03-10 DIAGNOSIS — Z3490 Encounter for supervision of normal pregnancy, unspecified, unspecified trimester: Secondary | ICD-10-CM | POA: Insufficient documentation

## 2014-03-10 DIAGNOSIS — Z130 Encounter for screening for diseases of the blood and blood-forming organs and certain disorders involving the immune mechanism: Secondary | ICD-10-CM

## 2014-03-10 DIAGNOSIS — O09521 Supervision of elderly multigravida, first trimester: Secondary | ICD-10-CM

## 2014-03-10 DIAGNOSIS — O3680X0 Pregnancy with inconclusive fetal viability, not applicable or unspecified: Secondary | ICD-10-CM

## 2014-03-10 DIAGNOSIS — Z3491 Encounter for supervision of normal pregnancy, unspecified, first trimester: Secondary | ICD-10-CM

## 2014-03-10 DIAGNOSIS — Z0184 Encounter for antibody response examination: Secondary | ICD-10-CM

## 2014-03-10 DIAGNOSIS — Z118 Encounter for screening for other infectious and parasitic diseases: Secondary | ICD-10-CM

## 2014-03-10 DIAGNOSIS — Z3401 Encounter for supervision of normal first pregnancy, first trimester: Secondary | ICD-10-CM

## 2014-03-10 DIAGNOSIS — Z331 Pregnant state, incidental: Secondary | ICD-10-CM

## 2014-03-10 DIAGNOSIS — O34219 Maternal care for unspecified type scar from previous cesarean delivery: Secondary | ICD-10-CM

## 2014-03-10 DIAGNOSIS — O209 Hemorrhage in early pregnancy, unspecified: Secondary | ICD-10-CM | POA: Diagnosis not present

## 2014-03-10 DIAGNOSIS — O3410 Maternal care for benign tumor of corpus uteri, unspecified trimester: Secondary | ICD-10-CM

## 2014-03-10 DIAGNOSIS — D259 Leiomyoma of uterus, unspecified: Secondary | ICD-10-CM | POA: Diagnosis not present

## 2014-03-10 DIAGNOSIS — O3421 Maternal care for scar from previous cesarean delivery: Secondary | ICD-10-CM

## 2014-03-10 DIAGNOSIS — Z0283 Encounter for blood-alcohol and blood-drug test: Secondary | ICD-10-CM

## 2014-03-10 DIAGNOSIS — Z1389 Encounter for screening for other disorder: Secondary | ICD-10-CM | POA: Diagnosis not present

## 2014-03-10 DIAGNOSIS — O09529 Supervision of elderly multigravida, unspecified trimester: Secondary | ICD-10-CM | POA: Insufficient documentation

## 2014-03-10 DIAGNOSIS — Z113 Encounter for screening for infections with a predominantly sexual mode of transmission: Secondary | ICD-10-CM

## 2014-03-10 DIAGNOSIS — O3411 Maternal care for benign tumor of corpus uteri, first trimester: Secondary | ICD-10-CM | POA: Diagnosis not present

## 2014-03-10 DIAGNOSIS — IMO0002 Reserved for concepts with insufficient information to code with codable children: Secondary | ICD-10-CM

## 2014-03-10 DIAGNOSIS — Z23 Encounter for immunization: Secondary | ICD-10-CM

## 2014-03-10 DIAGNOSIS — Z1159 Encounter for screening for other viral diseases: Secondary | ICD-10-CM

## 2014-03-10 LAB — POCT URINALYSIS DIPSTICK
Blood, UA: NEGATIVE
Glucose, UA: NEGATIVE
KETONES UA: NEGATIVE
NITRITE UA: NEGATIVE
Protein, UA: NEGATIVE

## 2014-03-10 NOTE — Patient Instructions (Signed)
Nausea & Vomiting  Have saltine crackers or pretzels by your bed and eat a few bites before you raise your head out of bed in the morning  Eat small frequent meals throughout the day instead of large meals  Drink plenty of fluids throughout the day to stay hydrated, just don't drink a lot of fluids with your meals.  This can make your stomach fill up faster making you feel sick  Do not brush your teeth right after you eat  Products with real ginger are good for nausea, like ginger ale and ginger hard candy Make sure it says made with real ginger!  Sucking on sour candy like lemon heads is also good for nausea  If your prenatal vitamins make you nauseated, take them at night so you will sleep through the nausea  Sea Bands  If you feel like you need medicine for the nausea & vomiting please let us know  If you are unable to keep any fluids or food down please let us know    First Trimester of Pregnancy The first trimester of pregnancy is from week 1 until the end of week 12 (months 1 through 3). A week after a sperm fertilizes an egg, the egg will implant on the wall of the uterus. This embryo will begin to develop into a baby. Genes from you and your partner are forming the baby. The female genes determine whether the baby is a boy or a girl. At 6-8 weeks, the eyes and face are formed, and the heartbeat can be seen on ultrasound. At the end of 12 weeks, all the baby's organs are formed.  Now that you are pregnant, you will want to do everything you can to have a healthy baby. Two of the most important things are to get good prenatal care and to follow your health care provider's instructions. Prenatal care is all the medical care you receive before the baby's birth. This care will help prevent, find, and treat any problems during the pregnancy and childbirth. BODY CHANGES Your body goes through many changes during pregnancy. The changes vary from woman to woman.  11. You may gain or lose a  couple of pounds at first. 12. You may feel sick to your stomach (nauseous) and throw up (vomit). If the vomiting is uncontrollable, call your health care provider. 13. You may tire easily. 14. You may develop headaches that can be relieved by medicines approved by your health care provider. 15. You may urinate more often. Painful urination may mean you have a bladder infection. 16. You may develop heartburn as a result of your pregnancy. 17. You may develop constipation because certain hormones are causing the muscles that push waste through your intestines to slow down. 18. You may develop hemorrhoids or swollen, bulging veins (varicose veins). 19. Your breasts may begin to grow larger and become tender. Your nipples may stick out more, and the tissue that surrounds them (areola) may become darker. 20. Your gums may bleed and may be sensitive to brushing and flossing. 21. Dark spots or blotches (chloasma, mask of pregnancy) may develop on your face. This will likely fade after the baby is born. 22. Your menstrual periods will stop. 23. You may have a loss of appetite. 24. You may develop cravings for certain kinds of food. 25. You may have changes in your emotions from day to day, such as being excited to be pregnant or being concerned that something may go wrong with the pregnancy and  baby. 16. You may have more vivid and strange dreams. 27. You may have changes in your hair. These can include thickening of your hair, rapid growth, and changes in texture. Some women also have hair loss during or after pregnancy, or hair that feels dry or thin. Your hair will most likely return to normal after your baby is born. WHAT TO EXPECT AT YOUR PRENATAL VISITS During a routine prenatal visit:  You will be weighed to make sure you and the baby are growing normally.  Your blood pressure will be taken.  Your abdomen will be measured to track your baby's growth.  The fetal heartbeat will be listened to  starting around week 10 or 12 of your pregnancy.  Test results from any previous visits will be discussed. Your health care provider may ask you:  How you are feeling.  If you are feeling the baby move.  If you have had any abnormal symptoms, such as leaking fluid, bleeding, severe headaches, or abdominal cramping.  If you have any questions. Other tests that may be performed during your first trimester include:  Blood tests to find your blood type and to check for the presence of any previous infections. They will also be used to check for low iron levels (anemia) and Rh antibodies. Later in the pregnancy, blood tests for diabetes will be done along with other tests if problems develop.  Urine tests to check for infections, diabetes, or protein in the urine.  An ultrasound to confirm the proper growth and development of the baby.  An amniocentesis to check for possible genetic problems.  Fetal screens for spina bifida and Down syndrome.  You may need other tests to make sure you and the baby are doing well. HOME CARE INSTRUCTIONS  Medicines  Follow your health care provider's instructions regarding medicine use. Specific medicines may be either safe or unsafe to take during pregnancy.  Take your prenatal vitamins as directed.  If you develop constipation, try taking a stool softener if your health care provider approves. Diet  Eat regular, well-balanced meals. Choose a variety of foods, such as meat or vegetable-based protein, fish, milk and low-fat dairy products, vegetables, fruits, and whole grain breads and cereals. Your health care provider will help you determine the amount of weight gain that is right for you.  Avoid raw meat and uncooked cheese. These carry germs that can cause birth defects in the baby.  Eating four or five small meals rather than three large meals a day may help relieve nausea and vomiting. If you start to feel nauseous, eating a few soda crackers  can be helpful. Drinking liquids between meals instead of during meals also seems to help nausea and vomiting.  If you develop constipation, eat more high-fiber foods, such as fresh vegetables or fruit and whole grains. Drink enough fluids to keep your urine clear or pale yellow. Activity and Exercise  Exercise only as directed by your health care provider. Exercising will help you:  Control your weight.  Stay in shape.  Be prepared for labor and delivery.  Experiencing pain or cramping in the lower abdomen or low back is a good sign that you should stop exercising. Check with your health care provider before continuing normal exercises.  Try to avoid standing for long periods of time. Move your legs often if you must stand in one place for a long time.  Avoid heavy lifting.  Wear low-heeled shoes, and practice good posture.  You may continue  to have sex unless your health care provider directs you otherwise. Relief of Pain or Discomfort  Wear a good support bra for breast tenderness.   Take warm sitz baths to soothe any pain or discomfort caused by hemorrhoids. Use hemorrhoid cream if your health care provider approves.   Rest with your legs elevated if you have leg cramps or low back pain.  If you develop varicose veins in your legs, wear support hose. Elevate your feet for 15 minutes, 3-4 times a day. Limit salt in your diet. Prenatal Care  Schedule your prenatal visits by the twelfth week of pregnancy. They are usually scheduled monthly at first, then more often in the last 2 months before delivery.  Write down your questions. Take them to your prenatal visits.  Keep all your prenatal visits as directed by your health care provider. Safety  Wear your seat belt at all times when driving.  Make a list of emergency phone numbers, including numbers for family, friends, the hospital, and police and fire departments. General Tips  Ask your health care provider for a  referral to a local prenatal education class. Begin classes no later than at the beginning of month 6 of your pregnancy.  Ask for help if you have counseling or nutritional needs during pregnancy. Your health care provider can offer advice or refer you to specialists for help with various needs.  Do not use hot tubs, steam rooms, or saunas.  Do not douche or use tampons or scented sanitary pads.  Do not cross your legs for long periods of time.  Avoid cat litter boxes and soil used by cats. These carry germs that can cause birth defects in the baby and possibly loss of the fetus by miscarriage or stillbirth.  Avoid all smoking, herbs, alcohol, and medicines not prescribed by your health care provider. Chemicals in these affect the formation and growth of the baby.  Schedule a dentist appointment. At home, brush your teeth with a soft toothbrush and be gentle when you floss. SEEK MEDICAL CARE IF:   You have dizziness.  You have mild pelvic cramps, pelvic pressure, or nagging pain in the abdominal area.  You have persistent nausea, vomiting, or diarrhea.  You have a bad smelling vaginal discharge.  You have pain with urination.  You notice increased swelling in your face, hands, legs, or ankles. SEEK IMMEDIATE MEDICAL CARE IF:   You have a fever.  You are leaking fluid from your vagina.  You have spotting or bleeding from your vagina.  You have severe abdominal cramping or pain.  You have rapid weight gain or loss.  You vomit blood or material that looks like coffee grounds.  You are exposed to [place] measles and have never had them.  You are exposed to fifth disease or chickenpox.  You develop a severe headache.  You have shortness of breath.  You have any kind of trauma, such as from a fall or a car accident. Document Released: 03/14/2001 Document Revised: 08/04/2013 Document Reviewed: 01/28/2013 [facility] Patient Information 2015 [place], [place]. This information  is not intended to replace advice given to you by your health care provider. Make sure you discuss any questions you have with your health care provider.

## 2014-03-10 NOTE — Progress Notes (Signed)
  Subjective:  Alexandra Mclaughlin is a 33 y.o. G97P1001 African American female at [redacted]w[redacted]d by today's u/s, being seen today for her first obstetrical visit.  Her obstetrical history is significant for prev c/s d/t NRFHR, AMA, multiple fibroids, and 3.9x2.4cm fundal area of hemorrhage beside where placenta is forming.  Pregnancy history fully reviewed.  Patient reports did have some vb the other day- wen to [facility]. Denies vb, cramping, uti s/s, abnormal/malodorous vag d/c, or vulvovaginal itching/irritation.  BP 126/66 mmHg  Wt 233 lb (105.688 kg)  LMP 01/07/2014 (Approximate)  HISTORY: OB History  Gravida Para Term Preterm AB SAB TAB Ectopic Multiple Living  2 1 1       1     # Outcome Date GA Lbr Len/2nd Weight Sex Delivery Anes PTL Lv  2 Current           1 Term 01/03/01 [redacted]w[redacted]d   M CS-LTranv EPI N Y     Complications: Fetal Intolerance    Obstetric Comments  Thinks she was 10cm, hadn't started pushing, c/s for [facility]   Past Medical History  Diagnosis Date  . Medical history non-contributory    Past Surgical History  Procedure Laterality Date  . Cesarean section     Family History  Problem Relation Age of Onset  . Cancer Mother   . Diabetes Father     Exam   System:     General: Well developed & nourished, no acute distress   Skin: Warm & dry, normal coloration and turgor, no rashes   Neurologic: Alert & oriented, normal mood   Cardiovascular: Regular rate & rhythm   Respiratory: Effort & rate normal, LCTAB, acyanotic   Abdomen: Soft, non tender   Extremities: normal strength, tone   Pelvic Exam:    Perineum: Normal perineum   Vulva: Normal, no lesions   Vagina:  Normal mucosa, normal discharge   Cervix: Normal, bulbous, appears closed   Uterus: Normal size/shape/contour for GA   Thin prep pap smear neg 06/2013  FHR: 178 via u/s   Assessment:   Pregnancy: G2P1001 Patient Active Problem List   Diagnosis Date Noted  . Supervision of normal pregnancy 03/10/2014   Priority: High  . History of cesarean delivery, antepartum 03/10/2014    Priority: High  . Uterine fibroid 02/09/2014    Priority: High  . Abdominal pain affecting pregnancy 02/09/2014    [redacted]w[redacted]d G2P1001 New OB visit H/o c/s Multiple fibroids Periplacental hemorrhage AMA   Plan:  Initial labs drawn Continue prenatal vitamins Problem list reviewed and updated Reviewed n/v relief measures and warning s/s to report Reviewed recommended weight gain based on pre-gravid BMI Encouraged well-balanced diet Genetic Screening discussed Integrated Screen: declined Cystic fibrosis screening discussed declined Ultrasound discussed; fetal survey: requested Follow up in 4 weeks for visit [place] completed Discussed option of TOLAC, consent given to take home and review Pelvic rest, increase fluids, miscarriage precautions d/t vb and periplacental hemorrhage  Tawnya Crook CNM, [facility] 03/10/2014 2:35 PM

## 2014-03-10 NOTE — Progress Notes (Signed)
U/S(10+3wks)-single active fetus, CRL c/w previous u/s dates, cx appears closed, anterior Gr 0 placenta, area of hemorrhage noted within fundal region of uterus/GS=3.9 x 2.4cm, multiple fibroids noted largest 2 = 5.3cm & 3.0cm, bilateral adnexa appears WNL FHR-178 bpm

## 2014-03-11 ENCOUNTER — Telehealth: Payer: Self-pay | Admitting: Women's Health

## 2014-03-11 ENCOUNTER — Encounter: Payer: Self-pay | Admitting: Women's Health

## 2014-03-11 DIAGNOSIS — A749 Chlamydial infection, unspecified: Secondary | ICD-10-CM

## 2014-03-11 DIAGNOSIS — O09899 Supervision of other high risk pregnancies, unspecified trimester: Secondary | ICD-10-CM | POA: Insufficient documentation

## 2014-03-11 DIAGNOSIS — O99019 Anemia complicating pregnancy, unspecified trimester: Secondary | ICD-10-CM | POA: Insufficient documentation

## 2014-03-11 DIAGNOSIS — O98811 Other maternal infectious and parasitic diseases complicating pregnancy, first trimester: Secondary | ICD-10-CM

## 2014-03-11 DIAGNOSIS — Z2839 Other underimmunization status: Secondary | ICD-10-CM | POA: Insufficient documentation

## 2014-03-11 DIAGNOSIS — Z283 Underimmunization status: Secondary | ICD-10-CM

## 2014-03-11 LAB — URINALYSIS, ROUTINE W REFLEX MICROSCOPIC
Bilirubin Urine: NEGATIVE
Glucose, UA: NEGATIVE mg/dL
Ketones, ur: NEGATIVE mg/dL
NITRITE: NEGATIVE
PH: 6 (ref 5.0–8.0)
Protein, ur: NEGATIVE mg/dL
SPECIFIC GRAVITY, URINE: 1.018 (ref 1.005–1.030)
Urobilinogen, UA: 0.2 mg/dL (ref 0.0–1.0)

## 2014-03-11 LAB — ABO AND RH: Rh Type: POSITIVE

## 2014-03-11 LAB — GC/CHLAMYDIA PROBE AMP
CT Probe RNA: POSITIVE — AB
GC Probe RNA: NEGATIVE

## 2014-03-11 LAB — DRUG SCREEN, URINE, NO CONFIRMATION
AMPHETAMINE SCRN UR: NEGATIVE
BENZODIAZEPINES.: NEGATIVE
Barbiturate Quant, Ur: NEGATIVE
COCAINE METABOLITES: NEGATIVE
Creatinine,U: 136 mg/dL
MARIJUANA METABOLITE: NEGATIVE
Methadone: NEGATIVE
Opiate Screen, Urine: NEGATIVE
PHENCYCLIDINE (PCP): NEGATIVE
Propoxyphene: NEGATIVE

## 2014-03-11 LAB — URINALYSIS, MICROSCOPIC ONLY
CASTS: NONE SEEN
Crystals: NONE SEEN

## 2014-03-11 LAB — SICKLE CELL SCREEN: Sickle Cell Screen: NEGATIVE

## 2014-03-11 LAB — OXYCODONE SCREEN, UA, RFLX CONFIRM: OXYCODONE SCRN UR: NEGATIVE ng/mL

## 2014-03-11 LAB — CBC
HCT: 30.6 % — ABNORMAL LOW (ref 36.0–46.0)
Hemoglobin: 10.2 g/dL — ABNORMAL LOW (ref 12.0–15.0)
MCH: 22.6 pg — AB (ref 26.0–34.0)
MCHC: 33.3 g/dL (ref 30.0–36.0)
MCV: 67.8 fL — ABNORMAL LOW (ref 78.0–100.0)
PLATELETS: 281 10*3/uL (ref 150–400)
RBC: 4.51 MIL/uL (ref 3.87–5.11)
RDW: 18.5 % — ABNORMAL HIGH (ref 11.5–15.5)
WBC: 13.3 10*3/uL — ABNORMAL HIGH (ref 4.0–10.5)

## 2014-03-11 LAB — RUBELLA SCREEN: Rubella: 2.44 Index — ABNORMAL HIGH (ref <0.90)

## 2014-03-11 LAB — ANTIBODY SCREEN: ANTIBODY SCREEN: NEGATIVE

## 2014-03-11 LAB — HIV ANTIBODY (ROUTINE TESTING W REFLEX): HIV 1&2 Ab, 4th Generation: NONREACTIVE

## 2014-03-11 LAB — HEPATITIS B SURFACE ANTIGEN: HEP B S AG: NEGATIVE

## 2014-03-11 LAB — RPR

## 2014-03-11 LAB — VARICELLA ZOSTER ANTIBODY, IGG

## 2014-03-11 MED ORDER — FUSION PLUS PO CAPS: 1.0000 | ORAL_CAPSULE | ORAL | Status: DC

## 2014-03-11 MED ORDER — AZITHROMYCIN 500 MG PO TABS: 1000.0000 mg | ORAL_TABLET | Freq: Once | ORAL | Status: DC

## 2014-03-11 NOTE — Telephone Encounter (Signed)
Pt returned call. Notified her of anemia, rx sent, to take pnv daily and increase fe-rich foods. Also notified of +CT and rx sent to her pharmacy. Azithromycin 1gm po x 1 also called in for partner, Kinisha Soper, dob 03/27/74, nkda to [facility] [place]. No sex x at least 7d after both treated.  Roma Schanz, CNM, WHNP-BC 03/11/2014 11:25 AM

## 2014-03-11 NOTE — Telephone Encounter (Signed)
LM for pt to return call. +CT, rx sent to pharmacy. Will also treat partner. Also anemia, rx sent to pharmacy.  Roma Schanz, CNM, [facility] 03/11/2014 10:29 AM

## 2014-03-12 LAB — URINE CULTURE: Colony Count: >=100000

## 2014-03-17 ENCOUNTER — Ambulatory Visit (INDEPENDENT_AMBULATORY_CARE_PROVIDER_SITE_OTHER): Payer: BC Managed Care – PPO | Admitting: Obstetrics and Gynecology

## 2014-03-17 ENCOUNTER — Encounter: Payer: Self-pay | Admitting: Obstetrics and Gynecology

## 2014-03-17 VITALS — BP 140/70 | Wt 219.0 lb

## 2014-03-17 DIAGNOSIS — O034 Incomplete spontaneous abortion without complication: Secondary | ICD-10-CM | POA: Diagnosis not present

## 2014-03-17 MED ORDER — MISOPROSTOL 200 MCG PO TABS: 800.0000 ug | ORAL_TABLET | Freq: Once | ORAL | Status: DC

## 2014-03-17 NOTE — Patient Instructions (Signed)
Miscarriage A miscarriage is the sudden loss of an unborn baby (fetus) before the 20th week of pregnancy. Most miscarriages happen in the first 3 months of pregnancy. Sometimes, it happens before a woman even knows she is pregnant. A miscarriage is also called a "spontaneous miscarriage" or "early pregnancy loss." Having a miscarriage can be an emotional experience. Talk with your caregiver about any questions you may have about miscarrying, the grieving process, and your future pregnancy plans. CAUSES   Problems with the fetal chromosomes that make it impossible for the baby to develop normally. Problems with the baby's genes or chromosomes are most often the result of errors that occur, by chance, as the embryo divides and grows. The problems are not inherited from the parents.  Infection of the cervix or uterus.   Hormone problems.   Problems with the cervix, such as having an incompetent cervix. This is when the tissue in the cervix is not strong enough to hold the pregnancy.   Problems with the uterus, such as an abnormally shaped uterus, uterine fibroids, or congenital abnormalities.   Certain medical conditions.   Smoking, drinking alcohol, or taking illegal drugs.   Trauma.  Often, the cause of a miscarriage is unknown.  SYMPTOMS   Vaginal bleeding or spotting, with or without cramps or pain.  Pain or cramping in the abdomen or lower back.  Passing fluid, tissue, or blood clots from the vagina. DIAGNOSIS  Your caregiver will perform a physical exam. You may also have an ultrasound to confirm the miscarriage. Blood or urine tests may also be ordered. TREATMENT   Sometimes, treatment is not necessary if you naturally pass all the fetal tissue that was in the uterus. If some of the fetus or placenta remains in the body (incomplete miscarriage), tissue left behind may become infected and must be removed. Usually, a dilation and curettage (D and C) procedure is performed.  During a D and C procedure, the cervix is widened (dilated) and any remaining fetal or placental tissue is gently removed from the uterus.  Antibiotic medicines are prescribed if there is an infection. Other medicines may be given to reduce the size of the uterus (contract) if there is a lot of bleeding.  If you have Rh negative blood and your baby was Rh positive, you will need a Rh immunoglobulin shot. This shot will protect any future baby from having Rh blood problems in future pregnancies. HOME CARE INSTRUCTIONS   Your caregiver may order bed rest or may allow you to continue light activity. Resume activity as directed by your caregiver.  Have someone help with home and family responsibilities during this time.   Keep track of the number of sanitary pads you use each day and how soaked (saturated) they are. Write down this information.   Do not use tampons. Do not douche or have sexual intercourse until approved by your caregiver.   Only take over-the-counter or prescription medicines for pain or discomfort as directed by your caregiver.   Do not take aspirin. Aspirin can cause bleeding.   Keep all follow-up appointments with your caregiver.   If you or your partner have problems with grieving, talk to your caregiver or seek counseling to help cope with the pregnancy loss. Allow enough time to grieve before trying to get pregnant again.  SEEK IMMEDIATE MEDICAL CARE IF:   You have severe cramps or pain in your back or abdomen.  You have a fever.  You pass large blood clots (walnut-sized   or larger) ortissue from your vagina. Save any tissue for your caregiver to inspect.   Your bleeding increases.   You have a thick, bad-smelling vaginal discharge.  You become lightheaded, weak, or you faint.   You have chills.  MAKE SURE YOU:  Understand these instructions.  Will watch your condition.  Will get help right away if you are not doing well or get  worse. Document Released: 09/13/2000 Document Revised: 07/15/2012 Document Reviewed: 05/09/2011 [employer] Patient Information 2015 [employer]. This information is not intended to replace advice given to you by your health care provider. Make sure you discuss any questions you have with your health care provider.  

## 2014-03-17 NOTE — Progress Notes (Signed)
High Risk Pregnancy Diagnosis(es):   Threatened abortion heavy bleeding today  G2P1001 [redacted]w[redacted]d Estimated Date of Delivery: 10/06/14  Blood pressure 140/70, weight 219 lb (99.338 kg), last menstrual period 01/07/2014.  Urinalysis: Not examined   HPI:pt is cramping and bleeding heavily Physical Examination: General appearance - alert, well appearing, and in no distress and in mild to moderate distress Mental status - alert, oriented to person, place, and time, depressed mood Pelvic - normal external genitalia, vulva, vagina, cervix, uterus and adnexa, VAGINA: normal appearing vagina with normal color and discharge, no lesions, CERVIX: normal appearing cervix without discharge or lesions, blood generous. , UTERUS: uterus is normal size, shape, consistency and nontender, u/s tvaginal  shows gest sac is detached and in lower ut segment 3 cm from cervix. No FHT, sac collapsed. + clots.  , ADNEXA: normal adnexa in size, nontender and no masses, no masses      BP weight and urine results all reviewed and noted. Patient reports good fetal movement, denies any bleeding and no rupture of membranes symptoms or regular contractions.  Fundal Height:  8 wk  Fetal Heart rate:  absent Edema:  n/a  Patient is without complaints. All questions were answered.  Assessment:  [redacted]w[redacted]d,   Incomplete ab. Well in progress toward spont ab. Blood type Rh pos  Medication(s) Plans:  Cytotec 800 mcg oral  Treatment Plan:  Expect spont expulsion within 2 days  Follow up in 2 weeks for discussion of contraception. Pt states that she wants a hysterectomy due to fibroids.

## 2014-03-18 ENCOUNTER — Encounter: Payer: BC Managed Care – PPO | Admitting: Women's Health

## 2014-04-01 ENCOUNTER — Ambulatory Visit: Payer: BC Managed Care – PPO | Admitting: Obstetrics and Gynecology

## 2014-04-01 ENCOUNTER — Encounter: Payer: Self-pay | Admitting: Obstetrics and Gynecology

## 2014-04-08 ENCOUNTER — Encounter: Payer: BC Managed Care – PPO | Admitting: Advanced Practice Midwife

## 2014-06-24 ENCOUNTER — Emergency Department: Payer: Self-pay | Admitting: Emergency Medicine

## 2014-07-01 ENCOUNTER — Encounter (HOSPITAL_COMMUNITY): Payer: Self-pay

## 2014-09-01 ENCOUNTER — Telehealth: Payer: Self-pay | Admitting: *Deleted

## 2014-09-01 ENCOUNTER — Other Ambulatory Visit: Payer: Self-pay | Admitting: Women's Health

## 2014-09-01 MED ORDER — CITRANATAL ASSURE 35-1 & 300 MG PO MISC: ORAL | Status: DC

## 2014-09-28 NOTE — Telephone Encounter (Signed)
Invalid number

## 2014-12-15 ENCOUNTER — Encounter (HOSPITAL_COMMUNITY): Payer: Self-pay | Admitting: *Deleted

## 2015-12-13 ENCOUNTER — Encounter: Payer: Self-pay | Admitting: Emergency Medicine

## 2015-12-13 ENCOUNTER — Emergency Department
Admission: EM | Admit: 2015-12-13 | Discharge: 2015-12-13 | Disposition: A | Discharge status: Home / Self Care | Payer: Medicaid Other | Attending: Student | Admitting: Student

## 2015-12-13 ENCOUNTER — Emergency Department: Payer: Medicaid Other

## 2015-12-13 DIAGNOSIS — Y9389 Activity, other specified: Secondary | ICD-10-CM | POA: Insufficient documentation

## 2015-12-13 DIAGNOSIS — Z79899 Other long term (current) drug therapy: Secondary | ICD-10-CM | POA: Insufficient documentation

## 2015-12-13 DIAGNOSIS — Y929 Unspecified place or not applicable: Secondary | ICD-10-CM | POA: Insufficient documentation

## 2015-12-13 DIAGNOSIS — S63502A Unspecified sprain of left wrist, initial encounter: Secondary | ICD-10-CM | POA: Insufficient documentation

## 2015-12-13 DIAGNOSIS — X58XXXA Exposure to other specified factors, initial encounter: Secondary | ICD-10-CM | POA: Insufficient documentation

## 2015-12-13 DIAGNOSIS — Y990 Civilian activity done for income or pay: Secondary | ICD-10-CM | POA: Insufficient documentation

## 2015-12-13 MED ORDER — IBUPROFEN 800 MG PO TABS: 800.0000 mg | ORAL_TABLET | Freq: Three times a day (TID) | ORAL | 0 refills | Status: DC | PRN

## 2015-12-13 NOTE — ED Notes (Signed)
Discharge instructions reviewed with patient. Questions fielded by this RN. Patient verbalizes understanding of instructions. Patient discharged home in stable condition per Beers PA. No acute distress noted at time of discharge.   

## 2015-12-13 NOTE — ED Provider Notes (Signed)
[facility] Emergency Department Provider Note  ____________________________________________  Time seen: Approximately 8:03 PM  I have reviewed the triage vital signs and the nursing notes.   HISTORY  Chief Complaint Wrist Pain    HPI Alexandra Mclaughlin is a 35 y.o. female presents for evaluation of left wrist pain 3 days with no known injury. 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   Past Medical History:  Diagnosis Date  . Medical history non-contributory     Patient Active Problem List   Diagnosis Date Noted  . Incomplete spontaneous abortion 03/17/2014  . Anemia affecting pregnancy, antepartum 03/11/2014  . Chlamydia infection affecting pregnancy in first trimester, antepartum 03/11/2014  . Susceptible to varicella (non-immune), currently pregnant 03/11/2014  . Supervision of normal pregnancy 03/10/2014  . History of cesarean delivery, antepartum 03/10/2014  . First trimester bleeding 03/10/2014  . AMA (advanced maternal age) multigravida 35+ 03/10/2014  . Uterine fibroid 02/09/2014  . Abdominal pain affecting pregnancy 02/09/2014    Past Surgical History:  Procedure Laterality Date  . CESAREAN SECTION      Prior to Admission medications   Medication Sig Start Date End Date Taking? Authorizing Provider  acetaminophen (TYLENOL) 500 MG tablet Take 1,000 mg by mouth every 6 (six) hours as needed. Pain    Historical Provider, MD  ibuprofen (ADVIL,MOTRIN) 800 MG tablet Take 1 tablet (800 mg total) by mouth every 8 (eight) hours as needed. 12/13/15   Pierce Crane Beers, PA-C  Iron-FA-B Cmp-C-Biot-Probiotic (FUSION PLUS) CAPS Take 1 capsule by mouth See admin instructions. 1 time daily between meals 03/11/14   Roma Schanz, CNM  misoprostol (CYTOTEC) 200 MCG tablet Take 4 tablets (800 mcg total) by mouth once. 03/17/14   Jonnie Kind, MD  naproxen (NAPROSYN)  500 MG tablet Take 1 tablet (500 mg total) by mouth 2 (two) times daily. Patient not taking: Reported on 02/07/2014 04/22/12   Fredia Sorrow, MD  Prenat w/o A-FeCbGl-DSS-FA-DHA [facility] ASSURE) 35-1 & 300 MG tablet One tablet and one capsule daily 09/01/14   Roma Schanz, CNM  Prenatal Vit-Fe Fumarate-FA (PRENATAL COMPLETE) 14-0.4 MG TABS Take 1 tablet by mouth daily. Patient not taking: Reported on 03/10/2014 02/07/14   Orpah Greek, MD    Allergies Review of patient's allergies indicates no known allergies.  Family History  Problem Relation Age of Onset  . Cancer Mother   . Diabetes Father     Social History Social History  Substance Use Topics  . Smoking status: Never Smoker  . Smokeless tobacco: Not on file  . Alcohol use No    Review of Systems Constitutional: No fever/chills Musculoskeletal: Positive for left wrist pain. Skin: Negative for rash. Neurological: Negative for headaches, focal weakness or numbness.  10-point ROS otherwise negative.  ____________________________________________   PHYSICAL EXAM:  VITAL SIGNS: ED Triage Vitals  Enc Vitals Group     BP 12/13/15 1959 126/70     Pulse Rate 12/13/15 1959 83     Resp 12/13/15 1959 18     Temp 12/13/15 1959 97.8 F (36.6 C)     Temp Source 12/13/15 1959 Oral     SpO2 12/13/15 1959 100 %     Weight 12/13/15 1931 190 lb (86.2 kg)     Height 12/13/15 1931 5' (1.524 m)     Head Circumference --      Peak Flow --  Pain Score 12/13/15 1931 9     Pain Loc --      Pain Edu? --      Excl. in [place]? --     Constitutional: Alert and oriented. Well appearing and in no acute distress. Musculoskeletal: Left wrist with point tenderness in the lateral aspect. Full range of motion distally neurovascularly intact. No evidence of edema swelling. Neurologic:  Normal speech and language. No gross focal neurologic deficits are appreciated. No gait instability. Skin:  Skin is warm, dry and intact. No rash  noted. Psychiatric: Mood and affect are normal. Speech and behavior are normal.  ____________________________________________   LABS (all labs ordered are listed, but only abnormal results are displayed)  Labs Reviewed - No data to display ____________________________________________  EKG   ____________________________________________  RADIOLOGY  No acute osseous findings. ____________________________________________   PROCEDURES  Procedure(s) performed: None  Critical Care performed: No  ____________________________________________   INITIAL IMPRESSION / ASSESSMENT AND PLAN / ED COURSE  Pertinent labs & imaging results that were available during my care of the patient were reviewed by me and considered in my medical decision making (see chart for details). Review of the [place] CSRS was performed in accordance of the [place] prior to dispensing any controlled drugs.  Left wrist sprain. Rx given for ibuprofen 800 milligrams 3 times a day work excuse 24 hours. Patient follow-up PCP or return ER with any worsening symptomology.  Clinical Course    ____________________________________________   FINAL CLINICAL IMPRESSION(S) / ED DIAGNOSES  Final diagnoses:  Left wrist sprain, initial encounter     This chart was dictated using voice recognition software/Dragon. Despite best efforts to proofread, errors can occur which can change the meaning. Any change was purely unintentional.    Arlyss Repress, PA-C 12/13/15 [id number]    Joanne Gavel, MD 12/13/15 [phone]

## 2015-12-13 NOTE — ED Triage Notes (Signed)
Patient ambulatory to triage with steady gait, without difficulty or distress noted; pt reports left wrist pain since Friday with no specific injury

## 2016-07-07 ENCOUNTER — Emergency Department
Admission: EM | Admit: 2016-07-07 | Discharge: 2016-07-07 | Disposition: A | Discharge status: Home / Self Care | Payer: BLUE CROSS/BLUE SHIELD | Attending: Student in an Organized Health Care Education/Training Program | Admitting: Student in an Organized Health Care Education/Training Program

## 2016-07-07 DIAGNOSIS — R112 Nausea with vomiting, unspecified: Secondary | ICD-10-CM | POA: Diagnosis not present

## 2016-07-07 DIAGNOSIS — R1032 Left lower quadrant pain: Secondary | ICD-10-CM | POA: Diagnosis present

## 2016-07-07 DIAGNOSIS — Z791 Long term (current) use of non-steroidal anti-inflammatories (NSAID): Secondary | ICD-10-CM | POA: Diagnosis not present

## 2016-07-07 LAB — URINALYSIS, COMPLETE (UACMP) WITH MICROSCOPIC
Bacteria, UA: NONE SEEN
Bilirubin Urine: NEGATIVE
Glucose, UA: NEGATIVE mg/dL
Hgb urine dipstick: NEGATIVE
Ketones, ur: NEGATIVE mg/dL
Leukocytes, UA: NEGATIVE
Nitrite: NEGATIVE
Protein, ur: NEGATIVE mg/dL
Specific Gravity, Urine: 1.02 (ref 1.005–1.030)
pH: 6 (ref 5.0–8.0)

## 2016-07-07 LAB — COMPREHENSIVE METABOLIC PANEL
ALBUMIN: 3.5 g/dL (ref 3.5–5.0)
ALT: 25 U/L (ref 14–54)
ANION GAP: 7 (ref 5–15)
AST: 31 U/L (ref 15–41)
Alkaline Phosphatase: 48 U/L (ref 38–126)
BILIRUBIN TOTAL: 0.2 mg/dL — AB (ref 0.3–1.2)
BUN: 20 mg/dL (ref 6–20)
CO2: 27 mmol/L (ref 22–32)
Calcium: 8.8 mg/dL — ABNORMAL LOW (ref 8.9–10.3)
Chloride: 105 mmol/L (ref 101–111)
Creatinine, Ser: 0.83 mg/dL (ref 0.44–1.00)
GFR calc Af Amer: >60 mL/min (ref >60)
Glucose, Bld: 104 mg/dL — ABNORMAL HIGH (ref 65–99)
POTASSIUM: 4.1 mmol/L (ref 3.5–5.1)
Sodium: 139 mmol/L (ref 135–145)
TOTAL PROTEIN: 6.8 g/dL (ref 6.5–8.1)

## 2016-07-07 LAB — LIPASE, BLOOD: Lipase: 27 U/L (ref 11–51)

## 2016-07-07 LAB — CBC
HCT: 33.4 % — ABNORMAL LOW (ref 35.0–47.0)
Hemoglobin: 10 g/dL — ABNORMAL LOW (ref 12.0–16.0)
MCH: 19.3 pg — ABNORMAL LOW (ref 26.0–34.0)
MCHC: 30.1 g/dL — ABNORMAL LOW (ref 32.0–36.0)
MCV: 64.3 fL — ABNORMAL LOW (ref 80.0–100.0)
Platelets: 253 10*3/uL (ref 150–440)
RBC: 5.19 MIL/uL (ref 3.80–5.20)
RDW: 21.5 % — ABNORMAL HIGH (ref 11.5–14.5)
WBC: 10.4 10*3/uL (ref 3.6–11.0)

## 2016-07-07 LAB — POCT PREGNANCY, URINE: PREG TEST UR: NEGATIVE

## 2016-07-07 MED ORDER — DICYCLOMINE HCL 10 MG PO CAPS: 10.0000 mg | ORAL_CAPSULE | Freq: Three times a day (TID) | ORAL | 0 refills | Status: DC | PRN

## 2016-07-07 NOTE — ED Provider Notes (Signed)
[facility] Emergency Department Provider Note    None    (approximate)  I have reviewed the triage vital signs and the nursing notes.   HISTORY  Chief Complaint Abdominal Pain    HPI Alexandra Mclaughlin is a 36 y.o. female presents with 1 week of intermittent abdominal pain that is "all over" but primarily in the left lower quadrant. Did have 2 episodes of nausea and emesis over the weekend but none over the past several days. Patient presents to the ER due to concern for being pregnant. Did not take a home pregnancy test. No fevers. Denies any pain right now. Denies any vaginal discharge. No dysuria or hematuria.   Past Medical History:  Diagnosis Date  . Medical history non-contributory    Family History  Problem Relation Age of Onset  . Cancer Mother   . Diabetes Father    Past Surgical History:  Procedure Laterality Date  . CESAREAN SECTION     Patient Active Problem List   Diagnosis Date Noted  . Incomplete spontaneous abortion 03/17/2014  . Anemia affecting pregnancy, antepartum 03/11/2014  . Chlamydia infection affecting pregnancy in first trimester, antepartum 03/11/2014  . Susceptible to varicella (non-immune), currently pregnant 03/11/2014  . Supervision of normal pregnancy 03/10/2014  . History of cesarean delivery, antepartum 03/10/2014  . First trimester bleeding 03/10/2014  . AMA (advanced maternal age) multigravida 35+ 03/10/2014  . Uterine fibroid 02/09/2014  . Abdominal pain affecting pregnancy 02/09/2014      Prior to Admission medications   Medication Sig Start Date End Date Taking? Authorizing Provider  acetaminophen (TYLENOL) 500 MG tablet Take 1,000 mg by mouth every 6 (six) hours as needed. Pain    Historical Provider, MD  dicyclomine (BENTYL) 10 MG capsule Take 1 capsule (10 mg total) by mouth 3 (three) times daily as needed for spasms. 07/07/16 07/21/16  Merlyn Lot, MD  ibuprofen (ADVIL,MOTRIN) 800 MG tablet  Take 1 tablet (800 mg total) by mouth every 8 (eight) hours as needed. 12/13/15   Pierce Crane Beers, PA-C  Iron-FA-B Cmp-C-Biot-Probiotic (FUSION PLUS) CAPS Take 1 capsule by mouth See admin instructions. 1 time daily between meals 03/11/14   Roma Schanz, CNM  misoprostol (CYTOTEC) 200 MCG tablet Take 4 tablets (800 mcg total) by mouth once. 03/17/14   Jonnie Kind, MD  naproxen (NAPROSYN) 500 MG tablet Take 1 tablet (500 mg total) by mouth 2 (two) times daily. Patient not taking: Reported on 02/07/2014 04/22/12   Fredia Sorrow, MD  Prenat w/o A-FeCbGl-DSS-FA-DHA [facility] ASSURE) 35-1 & 300 MG tablet One tablet and one capsule daily 09/01/14   Roma Schanz, CNM  Prenatal Vit-Fe Fumarate-FA (PRENATAL COMPLETE) 14-0.4 MG TABS Take 1 tablet by mouth daily. Patient not taking: Reported on 03/10/2014 02/07/14   Orpah Greek, MD    Allergies Patient has no known allergies.    Social History Social History  Substance Use Topics  . Smoking status: Never Smoker  . Smokeless tobacco: Never Used  . Alcohol use No    Review of Systems Patient denies headaches, rhinorrhea, blurry vision, numbness, shortness of breath, chest pain, edema, cough, abdominal pain, nausea, vomiting, diarrhea, dysuria, fevers, rashes or hallucinations unless otherwise stated above in HPI. ____________________________________________   PHYSICAL EXAM:  VITAL SIGNS: Vitals:   07/07/16 1737  BP: 132/73  Pulse: 93  Resp: 16  Temp: 98 F (36.7 C)    Constitutional: Alert and oriented. Well appearing and in no acute distress. Eyes:  Conjunctivae are normal. PERRL. EOMI. Head: Atraumatic. Nose: No congestion/rhinnorhea. Mouth/Throat: Mucous membranes are moist.  Oropharynx non-erythematous. Neck: No stridor. Painless ROM. No cervical spine tenderness to palpation Hematological/Lymphatic/Immunilogical: No cervical lymphadenopathy. Cardiovascular: Normal rate, regular rhythm. Grossly normal heart  sounds.  Good peripheral circulation. Respiratory: Normal respiratory effort.  No retractions. Lungs CTAB. Gastrointestinal: Soft and nontender. No distention. No abdominal bruits. No CVA tenderness. Musculoskeletal: No lower extremity tenderness nor edema.  No joint effusions. Neurologic:  Normal speech and language. No gross focal neurologic deficits are appreciated. No gait instability. Skin:  Skin is warm, dry and intact. No rash noted. Psychiatric: Mood and affect are normal. Speech and behavior are normal.  ____________________________________________   LABS (all labs ordered are listed, but only abnormal results are displayed)  Results for orders placed or performed during the hospital encounter of 07/07/16 (from the past 24 hour(s))  Lipase, blood     Status: None   Collection Time: 07/07/16  5:36 PM  Result Value Ref Range   Lipase 27 11 - 51 U/L  Comprehensive metabolic panel     Status: Abnormal   Collection Time: 07/07/16  5:36 PM  Result Value Ref Range   Sodium 139 135 - 145 mmol/L   Potassium 4.1 3.5 - 5.1 mmol/L   Chloride 105 101 - 111 mmol/L   CO2 27 22 - 32 mmol/L   Glucose, Bld 104 (H) 65 - 99 mg/dL   BUN 20 6 - 20 mg/dL   Creatinine, Ser 0.83 0.44 - 1.00 mg/dL   Calcium 8.8 (L) 8.9 - 10.3 mg/dL   Total Protein 6.8 6.5 - 8.1 g/dL   Albumin 3.5 3.5 - 5.0 g/dL   AST 31 15 - 41 U/L   ALT 25 14 - 54 U/L   Alkaline Phosphatase 48 38 - 126 U/L   Total Bilirubin 0.2 (L) 0.3 - 1.2 mg/dL   GFR calc non Af Amer >60 >60 mL/min   GFR calc Af Amer >60 >60 mL/min   Anion gap 7 5 - 15  CBC     Status: Abnormal   Collection Time: 07/07/16  5:36 PM  Result Value Ref Range   WBC 10.4 3.6 - 11.0 K/uL   RBC 5.19 3.80 - 5.20 MIL/uL   Hemoglobin 10.0 (L) 12.0 - 16.0 g/dL   HCT 33.4 (L) 35.0 - 47.0 %   MCV 64.3 (L) 80.0 - 100.0 fL   MCH 19.3 (L) 26.0 - 34.0 pg   MCHC 30.1 (L) 32.0 - 36.0 g/dL   RDW 21.5 (H) 11.5 - 14.5 %   Platelets 253 150 - 440 K/uL  Urinalysis,  Complete w Microscopic     Status: Abnormal   Collection Time: 07/07/16  5:36 PM  Result Value Ref Range   Color, Urine YELLOW (A) YELLOW   APPearance CLEAR (A) CLEAR   Specific Gravity, Urine 1.020 1.005 - 1.030   pH 6.0 5.0 - 8.0   Glucose, UA NEGATIVE NEGATIVE mg/dL   Hgb urine dipstick NEGATIVE NEGATIVE   Bilirubin Urine NEGATIVE NEGATIVE   Ketones, ur NEGATIVE NEGATIVE mg/dL   Protein, ur NEGATIVE NEGATIVE mg/dL   Nitrite NEGATIVE NEGATIVE   Leukocytes, UA NEGATIVE NEGATIVE   RBC / HPF 0-5 0 - 5 RBC/hpf   WBC, UA 0-5 0 - 5 WBC/hpf   Bacteria, UA NONE SEEN NONE SEEN   Squamous Epithelial / LPF 0-5 (A) NONE SEEN  Pregnancy, urine POC     Status: None   Collection Time:  07/07/16  5:51 PM  Result Value Ref Range   Preg Test, Ur NEGATIVE NEGATIVE   ____________________________________________ ____________________________________________  RADIOLOGY   ____________________________________________   PROCEDURES  Procedure(s) performed:  Procedures    Critical Care performed: no ____________________________________________   INITIAL IMPRESSION / ASSESSMENT AND PLAN / ED COURSE  Pertinent labs & imaging results that were available during my care of the patient were reviewed by me and considered in my medical decision making (see chart for details).  DDX: pregnancy, ectopic, enteritis, msk strain, gaseous pain  BRIAR SWORD is a 36 y.o. who presents to the ED with abdominal pain for 1 week as described above. Patient arrives afebrile and hemodynamically stable. She is in no acute distress. Pregnancy is negative. Blood work is otherwise reassuring. Her abdominal exam is soft and benign. Do not feel further diagnostic testing clinically indicated. She denies any vaginal discharge to indicate need for pelvic exam or diagnostic testing. I do feel patient is stable for close follow-up with PCP.  Have discussed with the patient and available family all diagnostics and  treatments performed thus far and all questions were answered to the best of my ability. The patient demonstrates understanding and agreement with plan.       ____________________________________________   FINAL CLINICAL IMPRESSION(S) / ED DIAGNOSES  Final diagnoses:  Left lower quadrant pain      NEW MEDICATIONS STARTED DURING THIS VISIT:  New Prescriptions   DICYCLOMINE (BENTYL) 10 MG CAPSULE    Take 1 capsule (10 mg total) by mouth 3 (three) times daily as needed for spasms.     Note:  This document was prepared using Dragon voice recognition software and may include unintentional dictation errors.    Merlyn Lot, MD 07/07/16 [phone]

## 2016-07-07 NOTE — ED Triage Notes (Signed)
Pt reports to ED w/ c/o abd pain x 1 week. Pt sts that it's "all over", c/o nausea w/ 2 episodes of emesis over weekend.  Pt concerned she might be pregnant, sts she missed last period, denies using at home preg test.  Pt A/OX4, resp even and unlabored, ambulatory to triage w/o issue

## 2016-07-07 NOTE — ED Notes (Addendum)
Pt c/o intermit "side pains" and conserned she may be pregnant. Pt denies any n/v/d or fever. Pt A&Ox4, NAD noted

## 2016-07-07 NOTE — Discharge Instructions (Signed)

## 2016-09-17 ENCOUNTER — Emergency Department
Admission: EM | Admit: 2016-09-17 | Discharge: 2016-09-17 | Disposition: A | Discharge status: Home / Self Care | Payer: BLUE CROSS/BLUE SHIELD | Attending: Emergency Medicine | Admitting: Emergency Medicine

## 2016-09-17 ENCOUNTER — Encounter: Payer: Self-pay | Admitting: Emergency Medicine

## 2016-09-17 ENCOUNTER — Emergency Department: Payer: BLUE CROSS/BLUE SHIELD

## 2016-09-17 DIAGNOSIS — Z87891 Personal history of nicotine dependence: Secondary | ICD-10-CM | POA: Insufficient documentation

## 2016-09-17 DIAGNOSIS — M79641 Pain in right hand: Secondary | ICD-10-CM

## 2016-09-17 DIAGNOSIS — Y92410 Unspecified street and highway as the place of occurrence of the external cause: Secondary | ICD-10-CM | POA: Insufficient documentation

## 2016-09-17 DIAGNOSIS — J3489 Other specified disorders of nose and nasal sinuses: Secondary | ICD-10-CM | POA: Insufficient documentation

## 2016-09-17 DIAGNOSIS — Y999 Unspecified external cause status: Secondary | ICD-10-CM | POA: Insufficient documentation

## 2016-09-17 DIAGNOSIS — T7491XA Unspecified adult maltreatment, confirmed, initial encounter: Secondary | ICD-10-CM | POA: Insufficient documentation

## 2016-09-17 DIAGNOSIS — Y939 Activity, unspecified: Secondary | ICD-10-CM | POA: Insufficient documentation
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
## 2016-10-02 IMAGING — CR DG WRIST COMPLETE 3+V*L*
1 series · 4 of 4 positions shown · non-contrast
Comparison: None.

CLINICAL DATA: Wrist pain for 4 days, no injury.

EXAM:
LEFT WRIST - COMPLETE 3+ VIEW

[Series 1: x wrist pa left · 0.14mm/px · 4 of 4 slices shown]
[im 1/4]
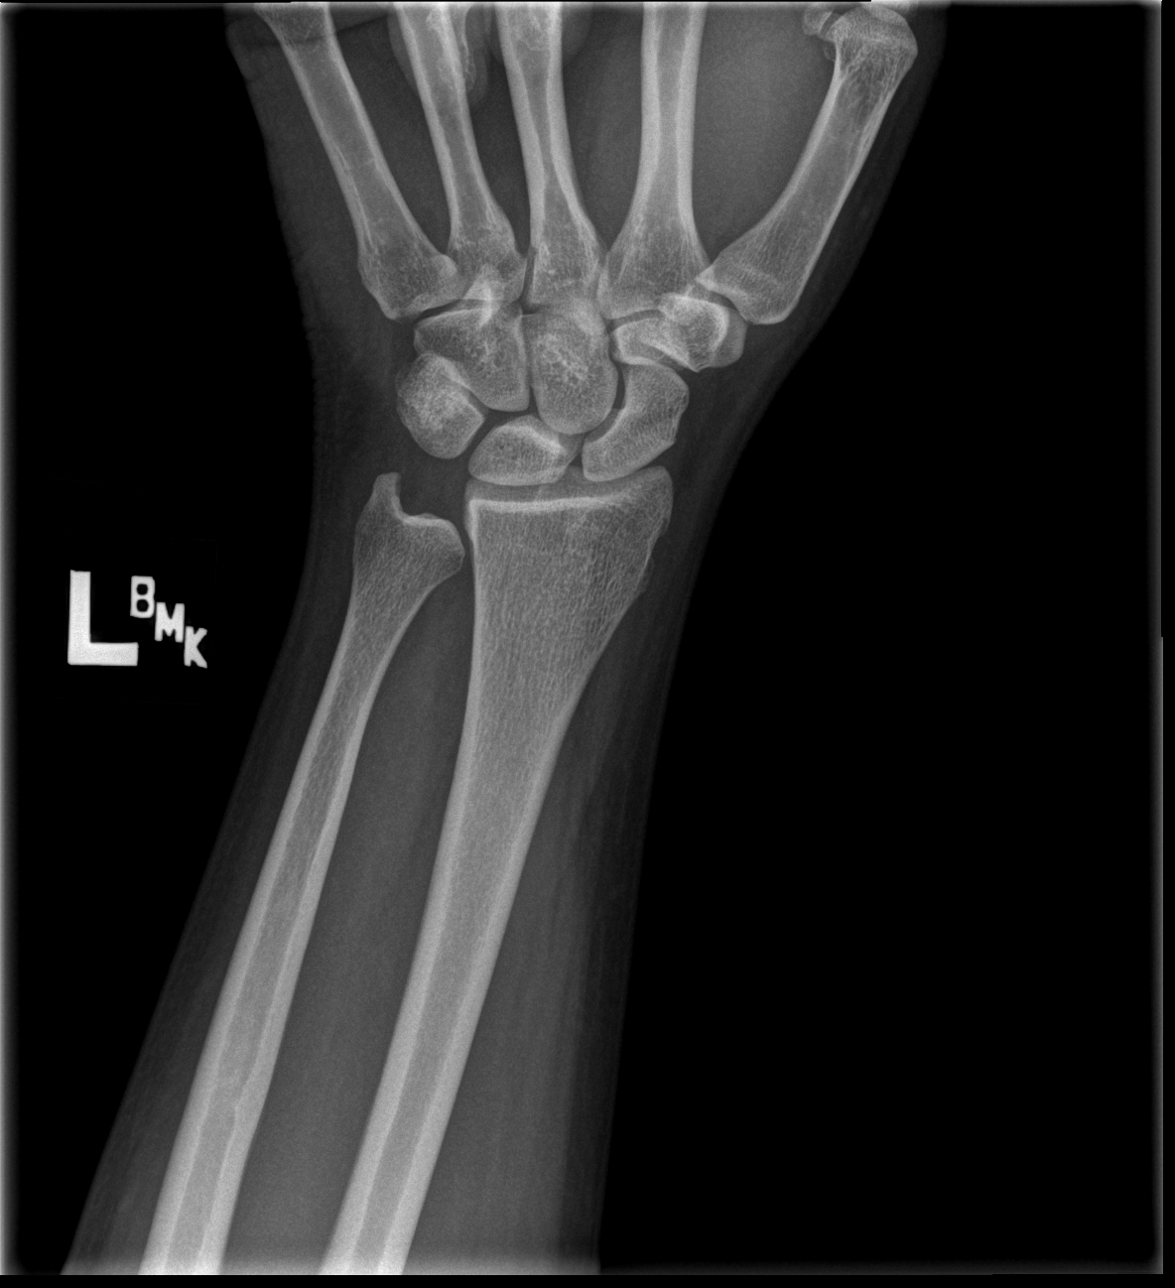
[im 2/4]
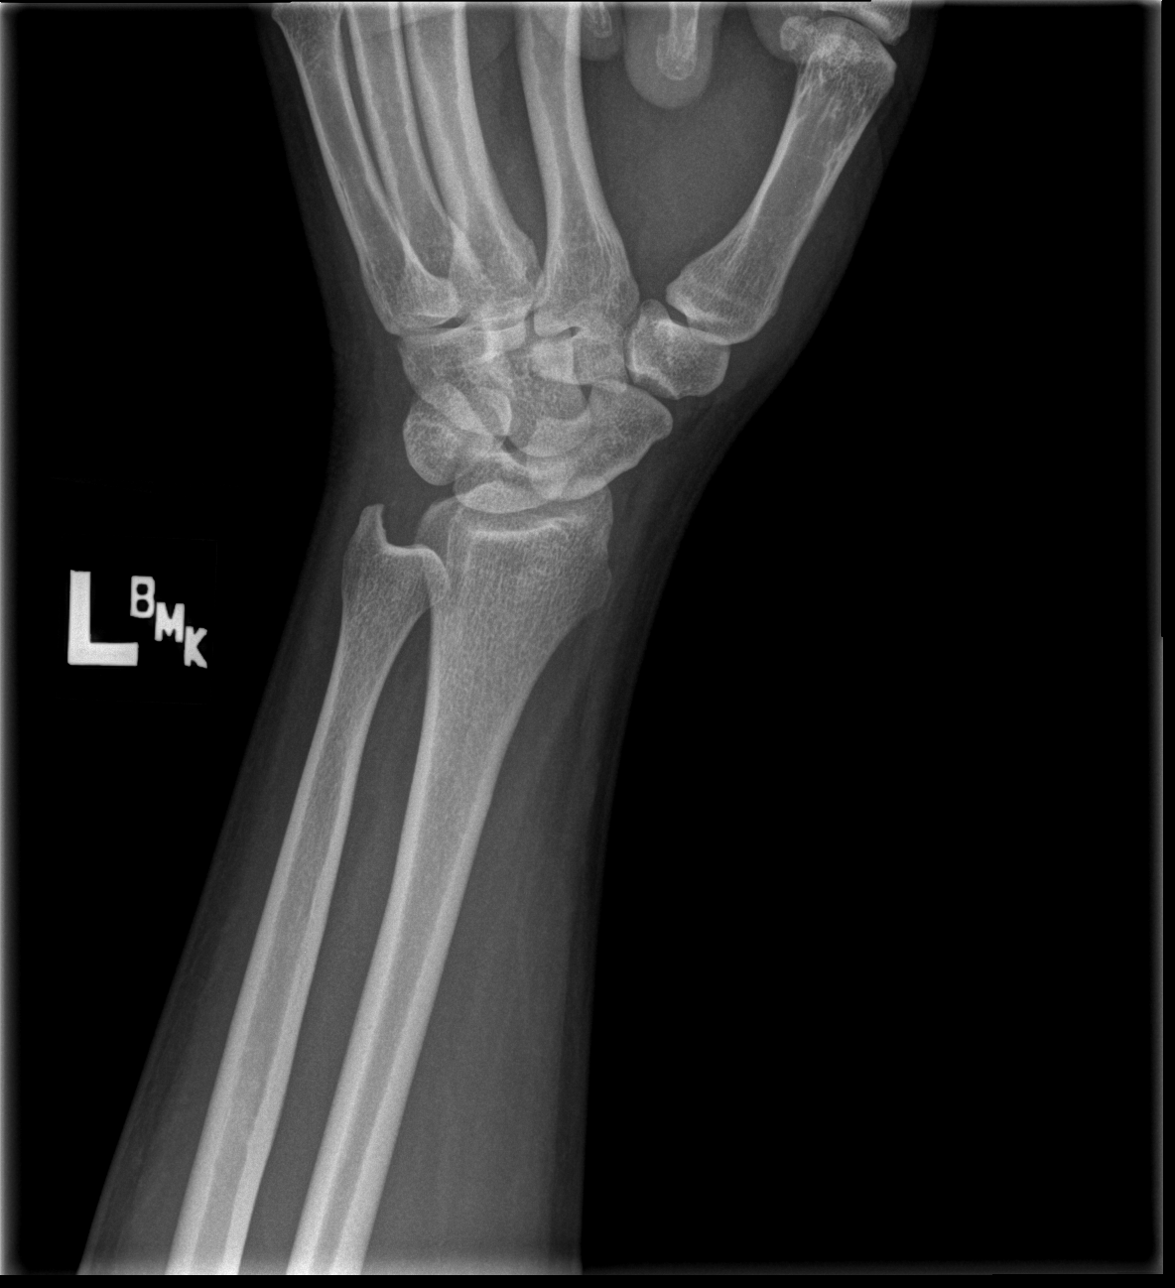
[im 3/4]
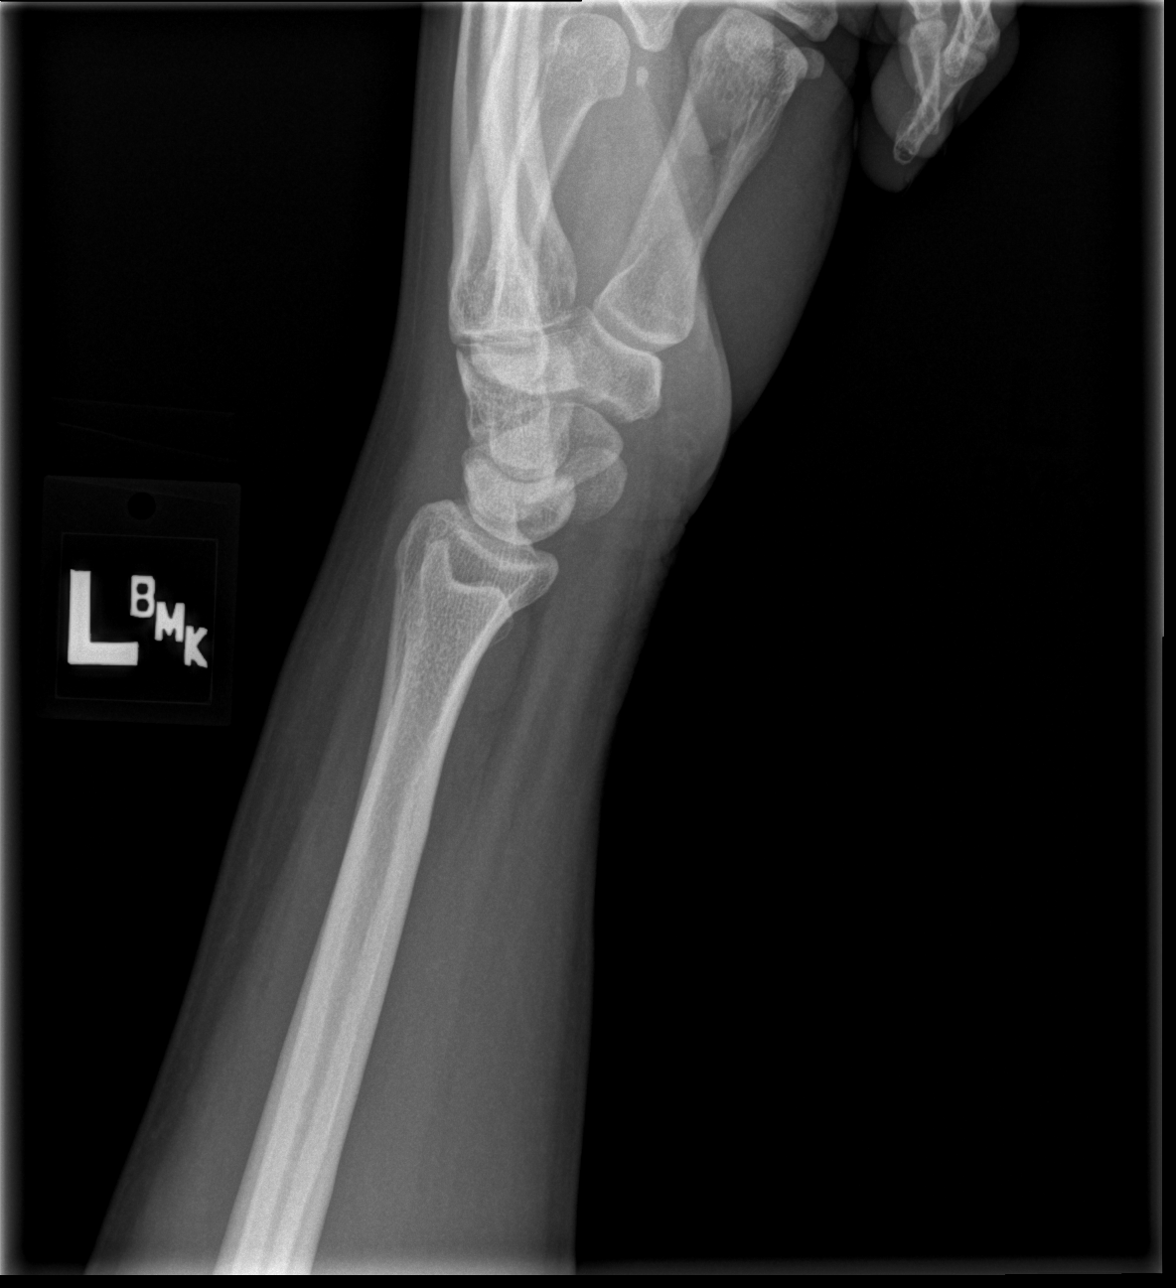
[im 4/4]
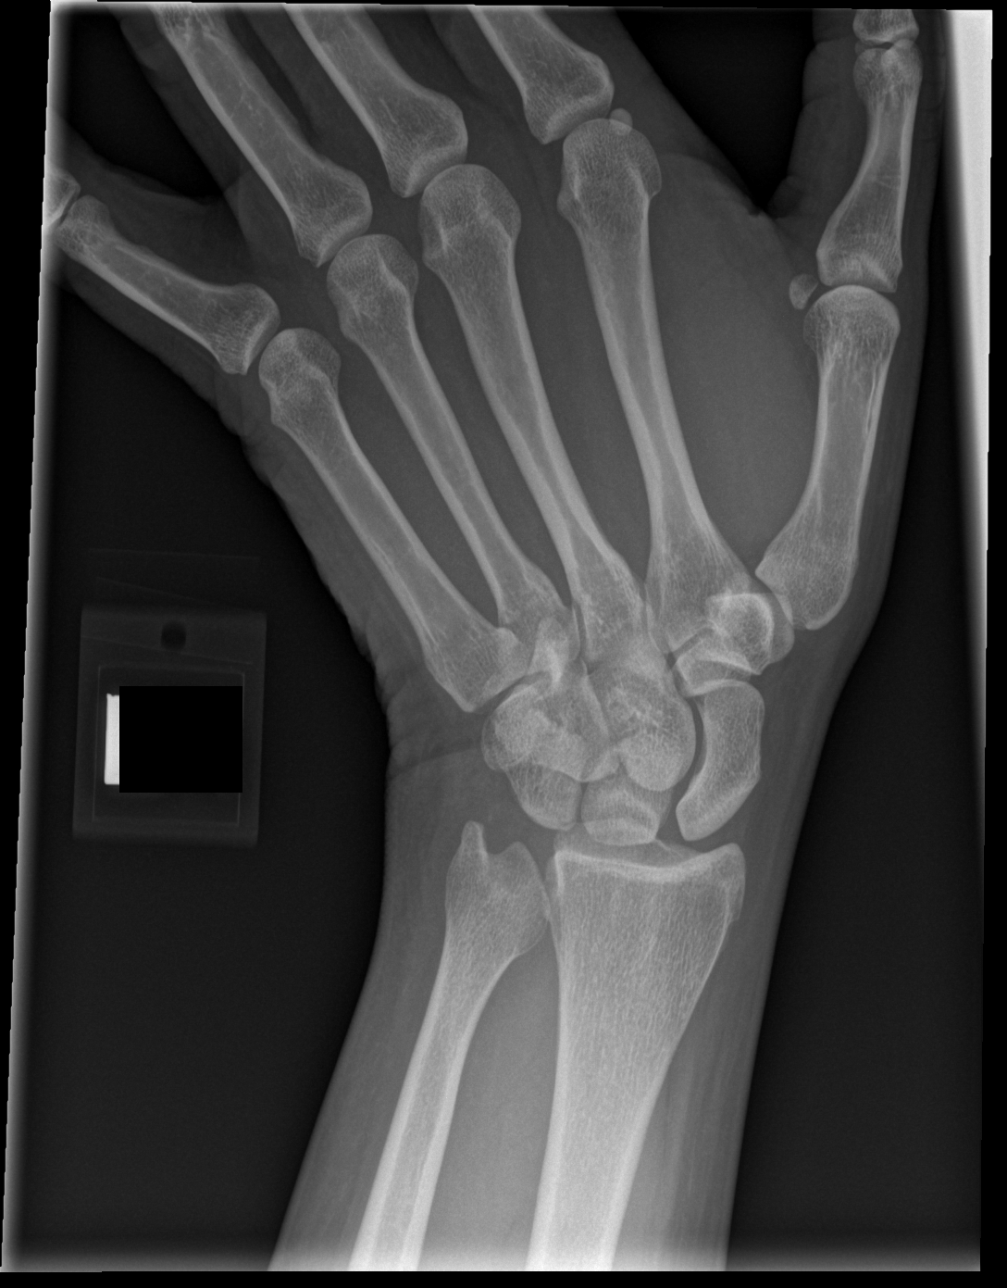

[4 of 4 positions shown; findings below may reference images not displayed]

FINDINGS: There is no evidence of fracture or dislocation. Shortened ulna with
respect to the distal radius without additional findings of negative
ulnar variance. There is no evidence of arthropathy or other focal
bone abnormality. Soft tissues are unremarkable.
IMPRESSION: Negative.

## 2017-02-15 ENCOUNTER — Encounter: Payer: Self-pay | Admitting: Emergency Medicine

## 2017-02-15 ENCOUNTER — Emergency Department
Admission: EM | Admit: 2017-02-15 | Discharge: 2017-02-15 | Disposition: A | Discharge status: Home / Self Care | Payer: BLUE CROSS/BLUE SHIELD | Attending: Emergency Medicine | Admitting: Emergency Medicine

## 2017-02-15 DIAGNOSIS — R55 Syncope and collapse: Secondary | ICD-10-CM | POA: Diagnosis present

## 2017-02-15 DIAGNOSIS — D509 Iron deficiency anemia, unspecified: Secondary | ICD-10-CM | POA: Diagnosis not present

## 2017-02-15 DIAGNOSIS — Z87891 Personal history of nicotine dependence: Secondary | ICD-10-CM | POA: Insufficient documentation

## 2017-02-15 LAB — BASIC METABOLIC PANEL
Anion gap: 7 (ref 5–15)
BUN: 12 mg/dL (ref 6–20)
CALCIUM: 8.5 mg/dL — AB (ref 8.9–10.3)
CO2: 24 mmol/L (ref 22–32)
CREATININE: 0.46 mg/dL (ref 0.44–1.00)
Chloride: 110 mmol/L (ref 101–111)
GFR calc Af Amer: >60 mL/min (ref >60)
GFR calc non Af Amer: >60 mL/min (ref >60)
GLUCOSE: 93 mg/dL (ref 65–99)
Potassium: 4.2 mmol/L (ref 3.5–5.1)
Sodium: 141 mmol/L (ref 135–145)

## 2017-02-15 LAB — HCG, QUANTITATIVE, PREGNANCY: HCG, BETA CHAIN, QUANT, S: 1 m[IU]/mL (ref <5)

## 2017-02-15 LAB — CBC
HCT: 28.6 % — ABNORMAL LOW (ref 35.0–47.0)
HEMOGLOBIN: 8.3 g/dL — AB (ref 12.0–16.0)
MCH: 16.8 pg — AB (ref 26.0–34.0)
MCHC: 29.2 g/dL — AB (ref 32.0–36.0)
MCV: 57.6 fL — ABNORMAL LOW (ref 80.0–100.0)
PLATELETS: 221 10*3/uL (ref 150–440)
RBC: 4.96 MIL/uL (ref 3.80–5.20)
RDW: 19.9 % — ABNORMAL HIGH (ref 11.5–14.5)
WBC: 6 10*3/uL (ref 3.6–11.0)

## 2017-02-15 LAB — IRON AND TIBC
Iron: 7 ug/dL — ABNORMAL LOW (ref 28–170)
SATURATION RATIOS: 1 % — AB (ref 10.4–31.8)
TIBC: 492 ug/dL — ABNORMAL HIGH (ref 250–450)
UIBC: 485 ug/dL

## 2017-02-15 LAB — GLUCOSE, CAPILLARY: GLUCOSE-CAPILLARY: 90 mg/dL (ref 65–99)

## 2017-02-15 LAB — TROPONIN I: Troponin I: <0.03 ng/mL (ref <0.03)

## 2017-02-15 LAB — FERRITIN: Ferritin: 5 ng/mL — ABNORMAL LOW (ref 11–307)

## 2017-02-15 MED ORDER — FERROUS SULFATE 325 (65 FE) MG PO TBEC: 325.0000 mg | DELAYED_RELEASE_TABLET | Freq: Three times a day (TID) | ORAL | 3 refills | Status: DC
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
## 2017-02-15 NOTE — ED Notes (Signed)
Pt ambulatory upon discharge. Pt and significant other verbalized understanding of discharge instructions, follow-up care, prescriptions and importance of iron-rich diet. A&O x4. VSS. Skin warm and dry.
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
## 2018-09-30 ENCOUNTER — Other Ambulatory Visit (HOSPITAL_COMMUNITY): Payer: Self-pay | Admitting: Obstetrics & Gynecology

## 2018-09-30 DIAGNOSIS — Z1231 Encounter for screening mammogram for malignant neoplasm of breast: Secondary | ICD-10-CM

## 2019-11-25 ENCOUNTER — Other Ambulatory Visit: Payer: Self-pay | Admitting: Obstetrics & Gynecology

## 2019-11-25 DIAGNOSIS — Z1231 Encounter for screening mammogram for malignant neoplasm of breast: Secondary | ICD-10-CM

## 2021-05-12 ENCOUNTER — Encounter: Payer: Self-pay | Admitting: Obstetrics & Gynecology

## 2021-06-15 ENCOUNTER — Encounter: Payer: BC Managed Care – PPO | Admitting: Obstetrics & Gynecology

## 2021-08-01 ENCOUNTER — Ambulatory Visit: Payer: Self-pay | Admitting: Nurse Practitioner

## 2021-08-10 ENCOUNTER — Ambulatory Visit (INDEPENDENT_AMBULATORY_CARE_PROVIDER_SITE_OTHER): Payer: Self-pay | Admitting: Nurse Practitioner

## 2021-08-10 ENCOUNTER — Other Ambulatory Visit: Payer: Self-pay

## 2021-08-10 ENCOUNTER — Encounter: Payer: Self-pay | Admitting: Nurse Practitioner

## 2021-08-10 VITALS — BP 148/88 | HR 90 | Temp 98.3°F | Ht 64.0 in | Wt 221.0 lb

## 2021-08-10 DIAGNOSIS — I10 Essential (primary) hypertension: Secondary | ICD-10-CM

## 2021-08-10 DIAGNOSIS — R079 Chest pain, unspecified: Secondary | ICD-10-CM

## 2021-08-10 DIAGNOSIS — Z7689 Persons encountering health services in other specified circumstances: Secondary | ICD-10-CM

## 2021-08-10 DIAGNOSIS — R1904 Left lower quadrant abdominal swelling, mass and lump: Secondary | ICD-10-CM

## 2021-08-10 MED ORDER — AMLODIPINE BESYLATE 5 MG PO TABS: 5.0000 mg | ORAL_TABLET | Freq: Every day | ORAL | 0 refills | Status: DC

## 2021-08-10 NOTE — Progress Notes (Signed)
? ?Subjective:  ? ? Patient ID: Alexandra Mclaughlin, female    DOB: 10-03-1980, 41 y.o.   MRN: [id number] ? ?HPI ?41 year old female patient here to the clinic with complaints of dizziness, shortness of breath, chest pain, and ankle swelling x2 months is here to establish care today.   ? ?Patient states that dizziness, sob, chest pain, ankle swelling usually occurs at work at night.  Patient states that she works third shift and notices the symptoms while at work.  Patient also admits to sometimes seeing dots in her vision.  Patient states episodes are typically after she has been active.  Patient states that symptoms resolve with resting.  Patient describes chest pain as a pounding sensation in her chest that she notices around the middle of her sternum.  Patient states that sometimes she can feel numbness and tingling in her arms as well.  Patient describes dizziness as feeling lightheaded.  Patient denies any orthopnea, chest pain radiation, syncopal episodes, wheezing, chest palpitations. ? ? ?Review of Systems  ?Respiratory:  Positive for shortness of breath.   ?Cardiovascular:  Positive for chest pain and leg swelling.  ?Neurological:  Positive for dizziness.  ?All other systems reviewed and are negative. ? ?   ?Objective:  ? Physical Exam ?Vitals reviewed.  ?Constitutional:   ?   General: She is not in acute distress. ?   Appearance: Normal appearance. She is obese. She is not ill-appearing, toxic-appearing or diaphoretic.  ?Cardiovascular:  ?   Rate and Rhythm: Normal rate and regular rhythm.  ?   Pulses: Normal pulses.  ?   Heart sounds: Normal heart sounds. No murmur heard. ?Pulmonary:  ?   Effort: Pulmonary effort is normal. No respiratory distress.  ?   Breath sounds: Normal breath sounds. No wheezing.  ?Abdominal:  ?   General: Bowel sounds are normal.  ?   Palpations: Abdomen is soft. There is mass.  ?   Tenderness: There is no abdominal tenderness. There is no guarding or rebound.  ?   Hernia: No hernia  is present.  ?   Comments: Firm mass noted to left lower quadrant.  Nontender to palpation.  ?Musculoskeletal:  ?   Cervical back: Normal range of motion and neck supple. No rigidity or tenderness.  ?   Comments: Grossly intact  ?Lymphadenopathy:  ?   Cervical: No cervical adenopathy.  ?Skin: ?   General: Skin is warm.  ?   Capillary Refill: Capillary refill takes less than 2 seconds.  ?Neurological:  ?   General: No focal deficit present.  ?   Mental Status: She is alert.  ?   Cranial Nerves: No cranial nerve deficit.  ?   Sensory: No sensory deficit.  ?   Motor: No weakness.  ?   Coordination: Coordination normal.  ?   Gait: Gait normal.  ?Psychiatric:     ?   Mood and Affect: Mood normal.     ?   Behavior: Behavior normal.  ? ? ?   ?Assessment & Plan:  ? ?1.  Establish care ?-Patient to return to clinic in 2 weeks for further evaluation ?-Patient to be sure that she brings insurance card within a week to avoid being charged for diagnostics ?-Patient would like for FMLA paperwork to be filled out since she is missing a lot of work.  Informed patient that I can only account for future appointments that we will possibly keep her out of work and I cannot account for past  missed days at work.  Patient stated understanding ? ?2. Chest pain, unspecified type ?-Currently believe patient is cardiovascularly stable. ?-EKG = normal sinus rhythm ?-No recent baseline lab work available for review.  We will stratify patient's risk factors with A1c, lipid profile. ?-We will also order urgent ECHO to rule out any structural changes to her heart secondary to high blood pressure. ?- EKG 12-Lead ?- HgB A1c ?- Lipid Profile ?- Ambulatory referral to Cardiology, urgent ?- CBC with Differential ?-Go to emergency room if chest pain occurs again to be evaluated. ?-Return to clinic in 2 weeks for blood pressure check. ? ?2. Primary hypertension ?-Blood pressure 148/88.  Upon recheck was 150/78.  Patient not at goal of 140/90. ?-We will  start amlodipine 5 mg today ?- amLODipine (NORVASC) 5 MG tablet; Take 1 tablet (5 mg total) by mouth daily.  Dispense: 30 tablet; Refill: 0 ?- CMP14+EGFR ?- Ambulatory referral to Cardiology ?- CBC with Differential ?- Urine Microalbumin w/creat. Ratio ?-Return to clinic in 2 weeks for blood pressure check ? ?3. Left lower quadrant abdominal mass ?-Felt firm mass during exam today ?-Abdominal ultrasound ordered to evaluate mass. ?- US Abdomen Complete ?-Return to clinic in 2 weeks ? ?  ?Note:  This document was prepared using Dragon voice recognition software and may include unintentional dictation errors. ?Note - This record has been created using [employer].  ?Chart creation errors have been sought, but may not always  ?have been located. Such creation errors do not reflect on  ?the standard of medical care. ? ?

## 2021-08-11 DIAGNOSIS — Z0289 Encounter for other administrative examinations: Secondary | ICD-10-CM

## 2021-08-24 ENCOUNTER — Ambulatory Visit: Payer: Self-pay | Admitting: Nurse Practitioner

## 2021-08-24 ENCOUNTER — Ambulatory Visit (HOSPITAL_COMMUNITY): Admission: RE | Admit: 2021-08-24 | Payer: BC Managed Care – PPO | Source: Ambulatory Visit

## 2021-09-08 ENCOUNTER — Ambulatory Visit (HOSPITAL_COMMUNITY): Admission: RE | Admit: 2021-09-08 | Payer: BC Managed Care – PPO | Source: Ambulatory Visit

## 2021-09-19 ENCOUNTER — Ambulatory Visit: Payer: Self-pay | Admitting: Nurse Practitioner

## 2021-09-20 ENCOUNTER — Inpatient Hospital Stay (HOSPITAL_COMMUNITY): Admission: RE | Admit: 2021-09-20 | Payer: BC Managed Care – PPO | Source: Ambulatory Visit

## 2021-09-29 ENCOUNTER — Inpatient Hospital Stay (HOSPITAL_COMMUNITY): Admission: RE | Admit: 2021-09-29 | Payer: BC Managed Care – PPO | Source: Ambulatory Visit

## 2021-09-29 NOTE — Progress Notes (Deleted)
CARDIOLOGY CONSULT NOTE       Patient ID: Alexandra Mclaughlin MRN: [id number] DOB/AGE: [birth year] 41 y.o.  Admit date: (Not on file) Referring Physician: Ameduite NP Primary Physician: Ameduite, Trenton Gammon, NP Primary Cardiologist: New Reason for Consultation: Chest pain / Dyspnea  Active Problems:   * No active hospital problems. *   HPI:  41 y.o. referred by Ameduite NP for chest pain and dyspnea Complains of dizziness, dyspnea and chest pain with LE edema for last 2 months mostly at work /night 3 rd shift Symptoms with activity better with rest Feels pounding sensation in chest in middle of her sternum Numbness and tingling in arms No frank syncope Started on norvasc 5 mg for HTN There was a question of mass in LLQ of abdomen on exam   Labs ordered but not done She has had significant anemia in past  Echo and [place] of abdomen ordered but not done   ECG was totally normal SR rate 88   ***  ROS All other systems reviewed and negative except as noted above  Past Medical History:  Diagnosis Date   Medical history non-contributory     Family History  Problem Relation Age of Onset   Cancer Mother    Diabetes Father     Social History   Socioeconomic History   Marital status: Married    Spouse name: Not on file   Number of children: Not on file   Years of education: Not on file   Highest education level: Not on file  Occupational History   Not on file  Tobacco Use   Smoking status: Former   Smokeless tobacco: Never  Substance and Sexual Activity   Alcohol use: No   Drug use: No   Sexual activity: Yes    Birth control/protection: None  Other Topics Concern   Not on file  Social History Narrative   Not on file   Social Determinants of Health   Financial Resource Strain: Not on file  Food Insecurity: Not on file  Transportation Needs: Not on file  Physical Activity: Not on file  Stress: Not on file  Social Connections: Not on file  Intimate Partner Violence: Not  on file    Past Surgical History:  Procedure Laterality Date   CESAREAN SECTION        Current Outpatient Medications:    acetaminophen (TYLENOL) 500 MG tablet, Take 1,000 mg by mouth every 6 (six) hours as needed. Pain, Disp: , Rfl:    amLODipine (NORVASC) 5 MG tablet, Take 1 tablet (5 mg total) by mouth daily., Disp: 30 tablet, Rfl: 0   dicyclomine (BENTYL) 10 MG capsule, Take 1 capsule (10 mg total) by mouth 3 (three) times daily as needed for spasms. (Patient not taking: Reported on 08/10/2021), Disp: 16 capsule, Rfl: 0   ferrous sulfate 325 (65 FE) MG EC tablet, Take 1 tablet (325 mg total) 3 (three) times daily with meals by mouth. (Patient not taking: Reported on 08/10/2021), Disp: 90 tablet, Rfl: 3   ibuprofen (ADVIL,MOTRIN) 800 MG tablet, Take 1 tablet (800 mg total) by mouth every 8 (eight) hours as needed. (Patient not taking: Reported on 08/10/2021), Disp: 30 tablet, Rfl: 0    Physical Exam: unknown if currently breastfeeding.    Affect appropriate Healthy:  appears stated age 41: normal Neck supple with no adenopathy JVP normal no bruits no thyromegaly Lungs clear with no wheezing and good diaphragmatic motion Heart:  S1/S2 no murmur, no rub, gallop or  click PMI normal Abdomen: benighn, BS positve, no tenderness, no AAA no bruit.  No HSM or HJR Distal pulses intact with no bruits No edema Neuro non-focal Skin warm and dry No muscular weakness   Labs:   Lab Results  Component Value Date   WBC 6.0 02/15/2017   HGB 8.3 (L) 02/15/2017   HCT 28.6 (L) 02/15/2017   MCV 57.6 (L) 02/15/2017   PLT 221 02/15/2017   No results for input(s): "NA", "K", "CL", "CO2", "BUN", "CREATININE", "CALCIUM", "PROT", "BILITOT", "ALKPHOS", "ALT", "AST", "GLUCOSE" in the last 168 hours.  Invalid input(s): "LABALBU" Lab Results  Component Value Date   TROPONINI <0.03 02/15/2017   No results found for: "CHOL" No results found for: "HDL" No results found for: "[place]" No results  found for: "TRIG" No results found for: "CHOLHDL" No results found for: "LDLDIRECT"    Radiology: No results found.  EKG: See HPI normal 08/10/21    ASSESSMENT AND PLAN:   Chest Pain: very atypical normal exam and ECG *** HTN:  continue norvasc f/u primary  Palpitations: ? Related to anemia TTE r/o structural dx and 14 day monitor Check TSH/T4 Dyspnea:  normal exam and ecg TTE as above Labs per primary    *** TTE 14 day monitor    F/U PRN if testing normal   Signed: Jenkins Rouge 09/29/2021, 5:07 PM

## 2021-10-05 ENCOUNTER — Ambulatory Visit: Payer: BC Managed Care – PPO | Admitting: Cardiovascular Disease

## 2021-10-12 ENCOUNTER — Ambulatory Visit (HOSPITAL_COMMUNITY): Admission: RE | Admit: 2021-10-12 | Payer: BC Managed Care – PPO | Source: Ambulatory Visit

## 2021-11-10 ENCOUNTER — Ambulatory Visit: Payer: BC Managed Care – PPO | Admitting: Nurse Practitioner

## 2021-11-16 ENCOUNTER — Other Ambulatory Visit: Payer: Self-pay

## 2021-11-16 ENCOUNTER — Emergency Department (HOSPITAL_COMMUNITY): Payer: BC Managed Care – PPO

## 2021-11-16 ENCOUNTER — Other Ambulatory Visit (HOSPITAL_COMMUNITY)
Admission: RE | Admit: 2021-11-16 | Discharge: 2021-11-16 | Disposition: A | Discharge status: Home / Self Care | Payer: BC Managed Care – PPO | Source: Ambulatory Visit | Attending: Nurse Practitioner | Admitting: Nurse Practitioner

## 2021-11-16 ENCOUNTER — Encounter: Payer: Self-pay | Admitting: Nurse Practitioner

## 2021-11-16 ENCOUNTER — Encounter (HOSPITAL_COMMUNITY): Payer: Self-pay | Admitting: Emergency Medicine

## 2021-11-16 ENCOUNTER — Telehealth: Payer: Self-pay | Admitting: *Deleted

## 2021-11-16 ENCOUNTER — Emergency Department (HOSPITAL_COMMUNITY)
Admission: EM | Admit: 2021-11-16 | Discharge: 2021-11-16 | Disposition: A | Discharge status: Home / Self Care | Payer: BC Managed Care – PPO | Attending: Emergency Medicine | Admitting: Emergency Medicine

## 2021-11-16 ENCOUNTER — Ambulatory Visit (INDEPENDENT_AMBULATORY_CARE_PROVIDER_SITE_OTHER): Payer: BC Managed Care – PPO | Admitting: Nurse Practitioner

## 2021-11-16 VITALS — BP 158/80 | HR 74 | Ht 64.0 in | Wt 218.5 lb

## 2021-11-16 DIAGNOSIS — Z13220 Encounter for screening for lipoid disorders: Secondary | ICD-10-CM | POA: Diagnosis not present

## 2021-11-16 DIAGNOSIS — R0683 Snoring: Secondary | ICD-10-CM

## 2021-11-16 DIAGNOSIS — D649 Anemia, unspecified: Secondary | ICD-10-CM | POA: Diagnosis not present

## 2021-11-16 DIAGNOSIS — Z131 Encounter for screening for diabetes mellitus: Secondary | ICD-10-CM

## 2021-11-16 DIAGNOSIS — N938 Other specified abnormal uterine and vaginal bleeding: Secondary | ICD-10-CM | POA: Diagnosis not present

## 2021-11-16 DIAGNOSIS — I10 Essential (primary) hypertension: Secondary | ICD-10-CM

## 2021-11-16 DIAGNOSIS — H539 Unspecified visual disturbance: Secondary | ICD-10-CM

## 2021-11-16 DIAGNOSIS — R079 Chest pain, unspecified: Secondary | ICD-10-CM

## 2021-11-16 DIAGNOSIS — R0602 Shortness of breath: Secondary | ICD-10-CM | POA: Diagnosis not present

## 2021-11-16 DIAGNOSIS — Z79899 Other long term (current) drug therapy: Secondary | ICD-10-CM | POA: Diagnosis not present

## 2021-11-16 DIAGNOSIS — N9489 Other specified conditions associated with female genital organs and menstrual cycle: Secondary | ICD-10-CM | POA: Diagnosis not present

## 2021-11-16 DIAGNOSIS — D259 Leiomyoma of uterus, unspecified: Secondary | ICD-10-CM | POA: Diagnosis not present

## 2021-11-16 DIAGNOSIS — D250 Submucous leiomyoma of uterus: Secondary | ICD-10-CM | POA: Diagnosis not present

## 2021-11-16 LAB — I-STAT CHEM 8, ED
BUN: 6 mg/dL (ref 6–20)
Calcium, Ion: 1.21 mmol/L (ref 1.15–1.40)
Chloride: 107 mmol/L (ref 98–111)
Creatinine, Ser: 0.4 mg/dL — ABNORMAL LOW (ref 0.44–1.00)
Glucose, Bld: 106 mg/dL — ABNORMAL HIGH (ref 70–99)
HCT: 23 % — ABNORMAL LOW (ref 36.0–46.0)
Hemoglobin: 7.8 g/dL — ABNORMAL LOW (ref 12.0–15.0)
Potassium: 3.5 mmol/L (ref 3.5–5.1)
Sodium: 141 mmol/L (ref 135–145)
TCO2: 22 mmol/L (ref 22–32)

## 2021-11-16 LAB — CBC WITH DIFFERENTIAL/PLATELET
Abs Immature Granulocytes: 0.02 10*3/uL (ref 0.00–0.07)
Basophils Absolute: 0 10*3/uL (ref 0.0–0.1)
Basophils Relative: 0 %
Eosinophils Absolute: 0.1 10*3/uL (ref 0.0–0.5)
Eosinophils Relative: 2 %
HCT: 22.5 % — ABNORMAL LOW (ref 36.0–46.0)
Hemoglobin: 5.5 g/dL — CL (ref 12.0–15.0)
Immature Granulocytes: 1 %
Lymphocytes Relative: 35 %
Lymphs Abs: 1.5 10*3/uL (ref 0.7–4.0)
MCH: 14.1 pg — ABNORMAL LOW (ref 26.0–34.0)
MCHC: 24.4 g/dL — ABNORMAL LOW (ref 30.0–36.0)
MCV: 57.8 fL — ABNORMAL LOW (ref 80.0–100.0)
Monocytes Absolute: 0.5 10*3/uL (ref 0.1–1.0)
Monocytes Relative: 10 %
Neutro Abs: 2.3 10*3/uL (ref 1.7–7.7)
Neutrophils Relative %: 52 %
Platelets: 128 10*3/uL — ABNORMAL LOW (ref 150–400)
RBC: 3.89 MIL/uL (ref 3.87–5.11)
RDW: 23.8 % — ABNORMAL HIGH (ref 11.5–15.5)
Smear Review: ADEQUATE
WBC: 4.4 10*3/uL (ref 4.0–10.5)
nRBC: 0 % (ref 0.0–0.2)

## 2021-11-16 LAB — COMPREHENSIVE METABOLIC PANEL
ALT: 17 U/L (ref 0–44)
ALT: 16 U/L (ref 0–44)
AST: 18 U/L (ref 15–41)
AST: 18 U/L (ref 15–41)
Albumin: 3.7 g/dL (ref 3.5–5.0)
Albumin: 3.7 g/dL (ref 3.5–5.0)
Alkaline Phosphatase: 34 U/L — ABNORMAL LOW (ref 38–126)
Alkaline Phosphatase: 39 U/L (ref 38–126)
Anion gap: 4 — ABNORMAL LOW (ref 5–15)
Anion gap: 3 — ABNORMAL LOW (ref 5–15)
BUN: 9 mg/dL (ref 6–20)
BUN: 8 mg/dL (ref 6–20)
CO2: 24 mmol/L (ref 22–32)
CO2: 23 mmol/L (ref 22–32)
Calcium: 8.5 mg/dL — ABNORMAL LOW (ref 8.9–10.3)
Calcium: 8.4 mg/dL — ABNORMAL LOW (ref 8.9–10.3)
Chloride: 112 mmol/L — ABNORMAL HIGH (ref 98–111)
Chloride: 111 mmol/L (ref 98–111)
Creatinine, Ser: 0.59 mg/dL (ref 0.44–1.00)
Creatinine, Ser: 0.49 mg/dL (ref 0.44–1.00)
GFR, Estimated: >60 mL/min (ref >60)
GFR, Estimated: >60 mL/min (ref >60)
Glucose, Bld: 126 mg/dL — ABNORMAL HIGH (ref 70–99)
Glucose, Bld: 91 mg/dL (ref 70–99)
Potassium: 3.8 mmol/L (ref 3.5–5.1)
Potassium: 3.7 mmol/L (ref 3.5–5.1)
Sodium: 140 mmol/L (ref 135–145)
Sodium: 137 mmol/L (ref 135–145)
Total Bilirubin: 0.9 mg/dL (ref 0.3–1.2)
Total Bilirubin: 0.5 mg/dL (ref 0.3–1.2)
Total Protein: 6.8 g/dL (ref 6.5–8.1)
Total Protein: 7.1 g/dL (ref 6.5–8.1)

## 2021-11-16 LAB — CBC
HCT: 23 % — ABNORMAL LOW (ref 36.0–46.0)
Hemoglobin: 5.7 g/dL — CL (ref 12.0–15.0)
MCH: 14.3 pg — ABNORMAL LOW (ref 26.0–34.0)
MCHC: 24.8 g/dL — ABNORMAL LOW (ref 30.0–36.0)
MCV: 57.6 fL — ABNORMAL LOW (ref 80.0–100.0)
Platelets: 135 10*3/uL — ABNORMAL LOW (ref 150–400)
RBC: 3.99 MIL/uL (ref 3.87–5.11)
RDW: 23.7 % — ABNORMAL HIGH (ref 11.5–15.5)
WBC: 4.9 10*3/uL (ref 4.0–10.5)
nRBC: 0 % (ref 0.0–0.2)

## 2021-11-16 LAB — IRON AND TIBC
Iron: 11 ug/dL — ABNORMAL LOW (ref 28–170)
Saturation Ratios: 2 % — ABNORMAL LOW (ref 10.4–31.8)
TIBC: 577 ug/dL — ABNORMAL HIGH (ref 250–450)
UIBC: 566 ug/dL

## 2021-11-16 LAB — FERRITIN: Ferritin: 2 ng/mL — ABNORMAL LOW (ref 11–307)

## 2021-11-16 LAB — TROPONIN I (HIGH SENSITIVITY)
Troponin I (High Sensitivity): <2 ng/L (ref <18)
Troponin I (High Sensitivity): 2 ng/L (ref <18)

## 2021-11-16 LAB — BRAIN NATRIURETIC PEPTIDE: B Natriuretic Peptide: 72 pg/mL (ref 0.0–100.0)

## 2021-11-16 LAB — RETICULOCYTES
Immature Retic Fract: 23.2 % — ABNORMAL HIGH (ref 2.3–15.9)
RBC.: 3.99 MIL/uL (ref 3.87–5.11)
Retic Count, Absolute: 57.1 10*3/uL (ref 19.0–186.0)
Retic Ct Pct: 1.4 % (ref 0.4–3.1)

## 2021-11-16 LAB — POC OCCULT BLOOD, ED: Fecal Occult Bld: NEGATIVE

## 2021-11-16 LAB — FOLATE: Folate: 15.3 ng/mL (ref >5.9)

## 2021-11-16 LAB — VITAMIN B12: Vitamin B-12: 393 pg/mL (ref 180–914)

## 2021-11-16 LAB — I-STAT BETA HCG BLOOD, ED (MC, WL, AP ONLY): I-stat hCG, quantitative: <5 m[IU]/mL (ref <5)

## 2021-11-16 LAB — PROTIME-INR
INR: 1.1 (ref 0.8–1.2)
Prothrombin Time: 13.8 seconds (ref 11.4–15.2)

## 2021-11-16 LAB — PREPARE RBC (CROSSMATCH)

## 2021-11-16 MED ORDER — TRANEXAMIC ACID 650 MG PO TABS: 1300.0000 mg | ORAL_TABLET | Freq: Three times a day (TID) | ORAL | 0 refills | Status: DC

## 2021-11-16 MED ORDER — SODIUM CHLORIDE 0.9 % IV SOLN
10.0000 mL/h | Freq: Once | INTRAVENOUS | Status: AC
  Administered 2021-11-16: 10 mL/h via INTRAVENOUS

## 2021-11-16 MED ORDER — FERROUS SULFATE 325 (65 FE) MG PO TABS: 325.0000 mg | ORAL_TABLET | Freq: Every day | ORAL | 0 refills | Status: DC

## 2021-11-16 MED ORDER — AMLODIPINE BESYLATE 5 MG PO TABS: 5.0000 mg | ORAL_TABLET | Freq: Every day | ORAL | 0 refills | Status: DC

## 2021-11-16 NOTE — Telephone Encounter (Signed)
Patient notified of provider's recommendations and is heading to the ER for evaluation.

## 2021-11-16 NOTE — Discharge Instructions (Addendum)
Take the Lysteda at the start of your period.  It should help to decrease the bleeding.

## 2021-11-16 NOTE — ED Triage Notes (Signed)
Pt sent by PCP for low hgb labs.

## 2021-11-16 NOTE — ED Provider Notes (Signed)
[facility] EMERGENCY DEPARTMENT Provider Note   CSN: [id number] Arrival date & time: 11/16/21  1424     History  Chief Complaint  Patient presents with   Abnormal Lab    Alexandra Mclaughlin is a 41 y.o. female.  Pt is a 41 yo female with a pmhx significant for HTN.  She established care with her pcp in May after not seeing anyone for a few years.  She went back today because she was having chest pains and sob.  Her NP drew blood which showed a hgb of 5.5.  She told pt to come to the ED.  Pt does have a hx of heavy menstrual bleeding with fibroids, but has not seen an obgyn in several years.  She has been off her period for at least 2 weeks.  She denies any other bleeding.  No black or bloody stools.       Home Medications Prior to Admission medications   Medication Sig Start Date End Date Taking? Authorizing Provider  acetaminophen (TYLENOL) 500 MG tablet Take 1,000 mg by mouth every 6 (six) hours as needed. Pain   Yes [provider]  ferrous sulfate 325 (65 FE) MG tablet Take 1 tablet (325 mg total) by mouth daily. 11/16/21  Yes Isla Pence, MD  Multiple Vitamins-Minerals (MULTIVITAMIN WITH MINERALS) tablet Take 1 tablet by mouth daily.   Yes [provider]  tranexamic acid (LYSTEDA) 650 MG TABS tablet Take 2 tablets (1,300 mg total) by mouth 3 (three) times daily for 5 days. 11/16/21 11/21/21 Yes Isla Pence, MD  amLODipine (NORVASC) 5 MG tablet Take 1 tablet (5 mg total) by mouth daily. 11/16/21   [employer], Trenton Gammon, NP      Allergies    Patient has no known allergies.    Review of Systems   Review of Systems  Respiratory:  Positive for shortness of breath.   Cardiovascular:  Positive for chest pain.  All other systems reviewed and are negative.   Physical Exam Updated Vital Signs BP 139/88   Pulse 92   Temp 98.1 F (36.7 C) (Oral)   Resp 15   Ht '5\' 4"'$  (1.626 m)   Wt 97.5 kg   LMP 10/31/2021 (Approximate)   SpO2 100%   BMI 36.90 kg/m   Physical Exam Vitals and nursing note reviewed. Exam conducted with a chaperone present.  Constitutional:      Appearance: Normal appearance. She is obese.  HENT:     Head: Normocephalic and atraumatic.     Right Ear: External ear normal.     Left Ear: External ear normal.     Nose: Nose normal.     Mouth/Throat:     Mouth: Mucous membranes are moist.     Pharynx: Oropharynx is clear.  Eyes:     Extraocular Movements: Extraocular movements intact.     Conjunctiva/sclera: Conjunctivae normal.     Pupils: Pupils are equal, round, and reactive to light.  Cardiovascular:     Rate and Rhythm: Normal rate and regular rhythm.     Pulses: Normal pulses.     Heart sounds: Normal heart sounds.  Pulmonary:     Effort: Pulmonary effort is normal.     Breath sounds: Normal breath sounds.  Abdominal:     General: Abdomen is flat. Bowel sounds are normal.     Palpations: Abdomen is soft.  Genitourinary:    Exam position: Knee-chest position.     Rectum: Guaiac result negative.  Musculoskeletal:  General: Normal range of motion.     Cervical back: Normal range of motion and neck supple.  Skin:    General: Skin is warm.     Capillary Refill: Capillary refill takes less than 2 seconds.  Neurological:     General: No focal deficit present.     Mental Status: She is alert and oriented to person, place, and time.  Psychiatric:        Mood and Affect: Mood normal.        Behavior: Behavior normal.     ED Results / Procedures / Treatments   Labs (all labs ordered are listed, but only abnormal results are displayed) Labs Reviewed  COMPREHENSIVE METABOLIC PANEL - Abnormal; Notable for the following components:      Result Value   Calcium 8.4 (*)    Anion gap 3 (*)    All other components within normal limits  CBC - Abnormal; Notable for the following components:   Hemoglobin 5.7 (*)    HCT 23.0 (*)    MCV 57.6 (*)    MCH 14.3 (*)    MCHC 24.8 (*)    RDW 23.7 (*)     Platelets 135 (*)    All other components within normal limits  IRON AND TIBC - Abnormal; Notable for the following components:   Iron 11 (*)    TIBC 577 (*)    Saturation Ratios 2 (*)    All other components within normal limits  FERRITIN - Abnormal; Notable for the following components:   Ferritin 2 (*)    All other components within normal limits  RETICULOCYTES - Abnormal; Notable for the following components:   Immature Retic Fract 23.2 (*)    All other components within normal limits  I-STAT CHEM 8, ED - Abnormal; Notable for the following components:   Creatinine, Ser 0.40 (*)    Glucose, Bld 106 (*)    Hemoglobin 7.8 (*)    HCT 23.0 (*)    All other components within normal limits  PROTIME-INR  VITAMIN B12  FOLATE  URINALYSIS, ROUTINE W REFLEX MICROSCOPIC  POC OCCULT BLOOD, ED  I-STAT BETA HCG BLOOD, ED ([facility], [facility], [facility] ONLY)  TYPE AND SCREEN  PREPARE RBC (CROSSMATCH)  TROPONIN I (HIGH SENSITIVITY)    EKG EKG Interpretation  Date/Time:  Wednesday November 16 2021 16:36:14 EDT Ventricular Rate:  79 PR Interval:  148 QRS Duration: 85 QT Interval:  375 QTC Calculation: 430 R Axis:   59 Text Interpretation: Sinus rhythm  PR interval has normalized Confirmed by Isla Pence [phone]) on 11/16/2021 4:42:49 PM  Radiology US Pelvis Complete  Result Date: 11/16/2021 CLINICAL DATA:  Dysfunctional uterine bleeding EXAM: TRANSABDOMINAL ULTRASOUND OF PELVIS TECHNIQUE: Both transabdominal and transvaginal ultrasound examinations of the pelvis were performed. Transabdominal technique was performed for global imaging of the pelvis including uterus, ovaries, adnexal regions, and pelvic cul-de-sac. COMPARISON:  None Available. FINDINGS: Uterus Measurements: 17.3 x 9.0 x 14.7 cm = volume: 1203 mL. Extremely bulky, heterogeneous fibroid uterus. Largest fibroid in the lower uterine segment has a large submucosal component and measures at least 7.8 cm. Endometrium Endometrial stripe is  distorted and effaced by fibroids, candidate segment measures up 1.0 cm in thickness. Right ovary Measurements: 4.3 x 3.3 x 1.9 cm = volume: 14 mL. Normal appearance/no adnexal mass. Left ovary Measurements: 4.5 x 4.1 x 4 2 cm = volume: 40 mL. Normal appearance/no adnexal mass. Other findings No abnormal free fluid. IMPRESSION: 1. Extremely bulky fibroid uterus.  Multiple submucosal fibroids, largest submucosal fibroid in the lower uterine segment measures at least 7.8 cm. 2. Endometrial stripe is distorted and effaced by fibroids, Candida segment visualized measuring up to 1.0 cm thickness without obvious focal abnormality. MRI may be helpful to further evaluate. Electronically Signed   By: Delanna Ahmadi M.D.   On: 11/16/2021 17:39   DG Chest Portable 1 View  Result Date: 11/16/2021 CLINICAL DATA:  Pt with SOB, Pt sent by PCP for low hgb labs l EXAM: PORTABLE CHEST - 1 VIEW COMPARISON:  04/22/2012 FINDINGS: Lungs are clear. Heart size upper limits normal. No effusion. Visualized bones unremarkable. IMPRESSION: No acute cardiopulmonary disease. Electronically Signed   By: Lucrezia Europe M.D.   On: 11/16/2021 17:02    Procedures Procedures    Medications Ordered in ED Medications  0.9 %  sodium chloride infusion (10 mL/hr Intravenous New Bag/Given 11/16/21 2039)    ED Course/ Medical Decision Making/ A&P                           Medical Decision Making Amount and/or Complexity of Data Reviewed Labs: ordered. Radiology: ordered.  Risk OTC drugs. Prescription drug management.   This patient presents to the ED for concern of cp, this involves an extensive number of treatment options, and is a complaint that carries with it a high risk of complications and morbidity.  The differential diagnosis includes cardiac, pulm, gi, anemia   Co morbidities that complicate the patient evaluation  Htn, dub   Additional history obtained:  Additional history obtained from epic chart review    Lab  Tests:  I Ordered, and personally interpreted labs.  The pertinent results include:  hgb 5.7 (last hgb 8.3 in 2018); cmp nl, preg neg, inr 1.1,    Imaging Studies ordered:  I ordered imaging studies including cxr, pelvic US  I independently visualized and interpreted imaging which showed  CXR: IMPRESSION:  No acute cardiopulmonary disease.  Pelvic US: IMPRESSION:  1. Extremely bulky fibroid uterus. Multiple submucosal fibroids,  largest submucosal fibroid in the lower uterine segment measures at  least 7.8 cm.    2. Endometrial stripe is distorted and effaced by fibroids, Candida  segment visualized measuring up to 1.0 cm thickness without obvious  focal abnormality. MRI may be helpful to further evaluate.   I agree with the radiologist interpretation   Cardiac Monitoring:  The patient was maintained on a cardiac monitor.  I personally viewed and interpreted the cardiac monitored which showed an underlying rhythm of: nsr   Medicines ordered and prescription drug management:  I ordered medication including blood for transfusion  for symptomatic anemia  Reevaluation of the patient after these medicines showed that the patient improved I have reviewed the patients home medicines and have made adjustments as needed   Test Considered:  [place]   Critical Interventions:  Blood transfusion   Problem List / ED Course:  Symptomatic anemia:  likely due to DUB.  2 units blood for transfusion ordered.  Pt is feeling much better.  She is d/c with TXA to take at the next period.  She is to f/u with obygn for further eval.   Reevaluation:  After the interventions noted above, I reevaluated the patient and found that they have :improved   Social Determinants of Health:  Lives at home   Dispostion:  After consideration of the diagnostic results and the patients response to treatment, I feel that the patent  would benefit from discharge with outpatient f/u.     CRITICAL  CARE Performed by: Isla Pence   Total critical care time: 30 minutes  Critical care time was exclusive of separately billable procedures and treating other patients.  Critical care was necessary to treat or prevent imminent or life-threatening deterioration.  Critical care was time spent personally by me on the following activities: development of treatment plan with patient and/or surrogate as well as nursing, discussions with consultants, evaluation of patient's response to treatment, examination of patient, obtaining history from patient or surrogate, ordering and performing treatments and interventions, ordering and review of laboratory studies, ordering and review of radiographic studies, pulse oximetry and re-evaluation of patient's condition.         Final Clinical Impression(s) / ED Diagnoses Final diagnoses:  Symptomatic anemia  Uterine leiomyoma, unspecified location  DUB (dysfunctional uterine bleeding)    Rx / DC Orders ED Discharge Orders          Ordered    tranexamic acid (LYSTEDA) 650 MG TABS tablet  3 times daily        11/16/21 1811    ferrous sulfate 325 (65 FE) MG tablet  Daily        11/16/21 1811              Isla Pence, MD 11/16/21 2211

## 2021-11-16 NOTE — Progress Notes (Signed)
Subjective:    Patient ID: Alexandra Mclaughlin, female    DOB: 09-12-1980, 41 y.o.   MRN: [id number]  HPI  Patient was seen in May for recurrent chest pain, dizziness hypertension, ankle swelling, and shortness of breath.  Patient had normal EKG at that time.  Patient was prescribed amlodipine 5 mg for elevated blood pressures at that time.  Patient was also scheduled for an echo.  Lab work was also ordered.  Unfortunately patient was unable to complete echo or get lab work.  Patient states that she also was not able to get her medications.  Patient presents today with similar symptoms.  Patient unable to check blood pressure at home.  Patient still complains of dizziness, palpitations where it feels like her heart is beating very fast, chest pain with exertion, ankle swelling, wheezing/snoring at night, and changes to vision.  Patient states that while driving a few days ago she noticed that the vision to her right eye went completely black.  Patient states vision returned a little while after.  Patient stated that she had chest pain yesterday while walking up a hill.  At that time patient did have shortness of breath, cough, and nausea.  Patient states that she sat down and felt better.  Patient denies any abdominal pain, orthopnea, syncopal episodes.  Review of Systems  Neurological:  Positive for dizziness.       Objective:   Physical Exam Vitals reviewed.  Constitutional:      General: She is not in acute distress.    Appearance: Normal appearance. She is normal weight. She is not ill-appearing, toxic-appearing or diaphoretic.  HENT:     Head: Normocephalic and atraumatic.  Cardiovascular:     Rate and Rhythm: Normal rate and regular rhythm.     Pulses: Normal pulses.     Heart sounds: No murmur heard.    Gallop present. S3 sounds present.  Pulmonary:     Effort: Pulmonary effort is normal. No respiratory distress.     Breath sounds: Normal breath sounds. No wheezing.   Abdominal:     General: Abdomen is flat. Bowel sounds are normal.     Palpations: Abdomen is soft.  Musculoskeletal:     Right lower leg: No edema.     Left lower leg: No edema.     Comments: Grossly intact  Skin:    General: Skin is warm.     Capillary Refill: Capillary refill takes less than 2 seconds.  Neurological:     Mental Status: She is alert.     Comments: Grossly intact  Psychiatric:        Mood and Affect: Mood normal.        Behavior: Behavior normal.        Assessment & Plan:   1. Chest pain, unspecified type -Possible differential diagnosis congestive heart failure, angina, coronary artery disease. -Low suspicion for pulmonary embolism due to longevity of symptoms and intermittent nature of symptoms - Urine Microalbumin w/creat. ratio - Lipid panel - HgB A1c - EKG 12-Lead -Reviewed EKG.  EKG appears to be similar to previous EKG taken in May.  However due to risk factors will get a troponin to rule out MI. - amLODipine (NORVASC) 5 MG tablet; Take 1 tablet (5 mg total) by mouth daily.  Dispense: 30 tablet; Refill: 0 - DG Chest 2 View - Brain natriuretic peptide - Troponin I - Comprehensive metabolic panel - CBC with Differential - Ambulatory referral to Cardiology -Return to clinic  in 2 weeks for blood pressure check  2. Primary hypertension - Urine Microalbumin w/creat. ratio  3. Snoring -Suspect sleep apnea - Ambulatory referral to Sleep Studies  4. Changes in vision -Urgent referral to ophthalmology.  Suspect vision changes related to uncontrolled blood pressure - Ambulatory referral to Ophthalmology  5. Screening for diabetes mellitus - HgB A1c  6. Screening for cholesterol level - Lipid panel  Patient states that she had difficulty getting other test done due to not being able to take time off work.  Encourage patient that we will help her with FMLA paperwork to ensure that she can obtain scheduled appointments.  Patient to bring in FMLA   Paperwork    Note:  This document was prepared using Systems analyst and may include unintentional dictation errors. Note - This record has been created using [employer].  Chart creation errors have been sought, but may not always  have been located. Such creation errors do not reflect on  the standard of medical care.

## 2021-11-16 NOTE — Telephone Encounter (Signed)
Lab called a critical Hgb on patient of 5.5. Barbee Shropshire NP notified and advised to have patient go straight to ER for evaluation  Left message to return call

## 2021-11-16 NOTE — ED Notes (Signed)
Pt made aware of needing urine sample.

## 2021-11-16 NOTE — ED Notes (Signed)
Pt asked again if able to give urine sample. Pt states not at this time, but aware of needing one. Nurse aware.

## 2021-11-17 LAB — TYPE AND SCREEN
ABO/RH(D): A POS
Antibody Screen: NEGATIVE
Unit division: 0
Unit division: 0

## 2021-11-17 LAB — LIPID PANEL
Chol/HDL Ratio: 2.6 ratio (ref 0.0–4.4)
Cholesterol, Total: 132 mg/dL (ref 100–199)
HDL: 50 mg/dL (ref >39)
LDL Chol Calc (NIH): 66 mg/dL (ref 0–99)
Triglycerides: 81 mg/dL (ref 0–149)
VLDL Cholesterol Cal: 16 mg/dL (ref 5–40)

## 2021-11-17 LAB — BPAM RBC
Blood Product Expiration Date: 202309052359
Blood Product Expiration Date: 202309052359
ISSUE DATE / TIME: 202308161719
ISSUE DATE / TIME: 202308162022
Unit Type and Rh: 6200
Unit Type and Rh: 6200

## 2021-11-17 LAB — MICROALBUMIN / CREATININE URINE RATIO
Creatinine, Urine: 82.5 mg/dL
Microalb/Creat Ratio: 7 mg/g creat (ref 0–29)
Microalbumin, Urine: 5.5 ug/mL

## 2021-11-17 LAB — HEMOGLOBIN A1C
Est. average glucose Bld gHb Est-mCnc: 108 mg/dL
Hgb A1c MFr Bld: 5.4 % (ref 4.8–5.6)

## 2021-11-30 ENCOUNTER — Ambulatory Visit: Payer: BC Managed Care – PPO | Attending: Cardiology | Admitting: Cardiology

## 2021-11-30 ENCOUNTER — Encounter: Payer: Self-pay | Admitting: Nurse Practitioner

## 2021-11-30 ENCOUNTER — Encounter: Payer: Self-pay | Admitting: Cardiology

## 2021-11-30 ENCOUNTER — Encounter: Payer: Self-pay | Admitting: *Deleted

## 2021-11-30 ENCOUNTER — Telehealth: Payer: Self-pay

## 2021-11-30 ENCOUNTER — Ambulatory Visit (INDEPENDENT_AMBULATORY_CARE_PROVIDER_SITE_OTHER): Payer: BC Managed Care – PPO | Admitting: Nurse Practitioner

## 2021-11-30 VITALS — BP 124/78 | HR 72 | Temp 98.2°F | Ht 64.0 in | Wt 211.0 lb

## 2021-11-30 VITALS — BP 126/68 | HR 84 | Ht 64.0 in | Wt 213.8 lb

## 2021-11-30 DIAGNOSIS — R0602 Shortness of breath: Secondary | ICD-10-CM | POA: Diagnosis not present

## 2021-11-30 DIAGNOSIS — Z87898 Personal history of other specified conditions: Secondary | ICD-10-CM

## 2021-11-30 DIAGNOSIS — N939 Abnormal uterine and vaginal bleeding, unspecified: Secondary | ICD-10-CM

## 2021-11-30 DIAGNOSIS — D500 Iron deficiency anemia secondary to blood loss (chronic): Secondary | ICD-10-CM

## 2021-11-30 DIAGNOSIS — I10 Essential (primary) hypertension: Secondary | ICD-10-CM | POA: Diagnosis not present

## 2021-11-30 DIAGNOSIS — R011 Cardiac murmur, unspecified: Secondary | ICD-10-CM

## 2021-11-30 LAB — POC FIT TEST STOOL: Fecal Occult Blood: NEGATIVE

## 2021-11-30 MED ORDER — FERROUS SULFATE 325 (65 FE) MG PO TABS: 325.0000 mg | ORAL_TABLET | Freq: Every day | ORAL | 1 refills | Status: DC

## 2021-11-30 MED ORDER — AMLODIPINE BESYLATE 5 MG PO TABS: 5.0000 mg | ORAL_TABLET | Freq: Every day | ORAL | 0 refills | Status: DC

## 2021-11-30 NOTE — Patient Instructions (Signed)
Medication Instructions:  Your physician recommends that you continue on your current medications as directed. Please refer to the Current Medication list given to you today.   Labwork: None today  Testing/Procedures: Your physician has requested that you have an exercise tolerance test. For further information please visit [email or web address]. Please also follow instruction sheet, as given.   Your physician has requested that you have an echocardiogram. Echocardiography is a painless test that uses sound waves to create images of your heart. It provides your doctor with information about the size and shape of your heart and how well your heart's chambers and valves are working. This procedure takes approximately one hour. There are no restrictions for this procedure.   Follow-Up: We will call you with results  Any Other Special Instructions Will Be Listed Below (If Applicable).  If you need a refill on your cardiac medications before your next appointment, please call your pharmacy.

## 2021-11-30 NOTE — Progress Notes (Signed)
Subjective:    Patient ID: Alexandra Mclaughlin, female    DOB: December 22, 1980, 41 y.o.   MRN: [id number]  HPI  ED follow up / Anemia 41 year old female patient presents to the clinic today for ED follow-up.  Patient was sent to the emergency room with a critically low hemoglobin of 5.5.  Patient was seen in clinic prior to ED visit with complaints of shortness of breath, chest pain, heart palpitations.  Patient was given 2 units of blood in the emergency room and stated that she felt better.    Patient states that since that time she has not had any chest pain, shortness of breath, heart palpitations, swelling.    Patient continues to take iron pill daily for anemia without difficulty.  AUB Anemia likely secondary to patient's abnormal uterine bleeding.  Patient was given tranexamic acid to help with menstrual flow.  Patient states that she does have a history of fibroids and as a result has heavy but regular cycles.  Patient states that she uses 3 large pads per day and that her menstrual cycles typically last 5 days.   Patient has appointment with OB/GYN on September 25.  HTN Patient states that she has been taking her amlodipine without difficulty.   Review of Systems  All other systems reviewed and are negative.      Objective:   Physical Exam Vitals reviewed.  Constitutional:      General: She is not in acute distress.    Appearance: Normal appearance. She is normal weight. She is not ill-appearing, toxic-appearing or diaphoretic.  HENT:     Head: Normocephalic and atraumatic.  Cardiovascular:     Rate and Rhythm: Normal rate and regular rhythm.     Pulses: Normal pulses.     Heart sounds: Normal heart sounds. No murmur heard. Pulmonary:     Effort: Pulmonary effort is normal. No respiratory distress.     Breath sounds: Normal breath sounds. No wheezing.  Musculoskeletal:     Right lower leg: No edema.     Left lower leg: No edema.     Comments: Grossly intact  Skin:     General: Skin is warm.     Capillary Refill: Capillary refill takes less than 2 seconds.  Neurological:     Mental Status: She is alert.     Comments: Grossly intact  Psychiatric:        Mood and Affect: Mood normal.        Behavior: Behavior normal.        Assessment & Plan:   1. Iron deficiency anemia due to chronic blood loss -We will get repeat CBC today -Continue taking iron - ferrous sulfate 325 (65 FE) MG tablet; Take 1 tablet (325 mg total) by mouth daily.  Dispense: 30 tablet; Refill: 1 - CBC with Differential - Fecal Occult Blood testing negative 2 weeks ago. - RTC in 4 weeks for follow up  2. Abnormal uterine bleeding (AUB) -Follow-up with OB/GYN as scheduled -Consulted with OB/GYN provider to determine if additional imaging is necessary at this time.  3. Primary hypertension -Blood pressure today 124/78.  Blood pressure within goal of 140/90 or less. -Continue taking amlodipine as prescribed - amLODipine (NORVASC) 5 MG tablet; Take 1 tablet (5 mg total) by mouth daily.  Dispense: 90 tablet; Refill: 0     Note:  This document was prepared using Dragon voice recognition software and may include unintentional dictation errors. Note - This record has been created using  Editor, commissioning.  Chart creation errors have been sought, but may not always  have been located. Such creation errors do not reflect on  the standard of medical care.

## 2021-11-30 NOTE — Progress Notes (Signed)
Cardiology Office Note  Date: 11/30/2021   ID: Alexandra Mclaughlin 04/30/1980, MRN [id number]  PCP:  Ameduite, Trenton Gammon, NP  Cardiologist:  Rozann Lesches, MD Electrophysiologist:  None   Chief Complaint  Patient presents with   Chest pain and shortness of breath    History of Present Illness: Alexandra Mclaughlin is a 41 y.o. female referred for cardiology consultation by Ms. Ameduite NP for evaluation of chest pain.  I reviewed the available records.  She established care with PCP in May, I reviewed interval testing including more recently diagnosis of fairly significant anemia with hemoglobin down to 5.7.  She was seen in the ER recently, GU source suspected.  She received 2 units of PRBCs and was referred for gynecology evaluation given abnormal pelvic ultrasound.  She tells me that she has experienced an exertional chest tightness and shortness of breath associated with diaphoresis intermittently over several weeks.  Interestingly, since receiving PRBC transfusions her symptoms have improved although not completely resolved.  She has no history of chronic lung disease or ischemic heart disease.  She does report fairly heavy menses and has been told she was anemic in the past.  Uncertain whether she has ever had as severe and anemia as was more recently documented.  She was recently started on Norvasc for treatment of hypertension by her PCP.  Screening lipid panel looked good with LDL 66 on no specific treatment.  Past Medical History:  Diagnosis Date   Anemia    Essential hypertension     Past Surgical History:  Procedure Laterality Date   CESAREAN SECTION      Current Outpatient Medications  Medication Sig Dispense Refill   acetaminophen (TYLENOL) 500 MG tablet Take 1,000 mg by mouth every 6 (six) hours as needed. Pain     amLODipine (NORVASC) 5 MG tablet Take 1 tablet (5 mg total) by mouth daily. 90 tablet 0   ferrous sulfate 325 (65 FE) MG tablet Take 1 tablet (325 mg  total) by mouth daily. 30 tablet 1   Multiple Vitamins-Minerals (MULTIVITAMIN WITH MINERALS) tablet Take 1 tablet by mouth daily.     No current facility-administered medications for this visit.   Allergies:  Patient has no known allergies.   Social History: The patient  reports that she has quit smoking. She has never used smokeless tobacco. She reports that she does not drink alcohol and does not use drugs.   Family History: The patient's family history includes Cancer in her mother; Diabetes in her father.   ROS: No palpitations or syncope.  Physical Exam: VS:  BP 126/68   Pulse 84   Ht '5\' 4"'$  (1.626 m)   Wt 213 lb 12.8 oz (97 kg)   LMP 10/31/2021 (Approximate)   SpO2 98%   BMI 36.70 kg/m , BMI Body mass index is 36.7 kg/m.  Wt Readings from Last 3 Encounters:  11/30/21 213 lb 12.8 oz (97 kg)  11/30/21 211 lb (95.7 kg)  11/16/21 215 lb (97.5 kg)    General: Patient appears comfortable at rest. HEENT: Conjunctiva and lids normal. Neck: Supple, no elevated JVP or carotid bruits, no thyromegaly. Lungs: Clear to auscultation, nonlabored breathing at rest. Cardiac: Regular rate and rhythm, no S3, 2/6 systolic murmur, no pericardial rub. Abdomen: Soft, nontender, bowel sounds present. Extremities: No pitting edema, distal pulses 2+. Skin: Warm and dry. Musculoskeletal: No kyphosis. Neuropsychiatric: Alert and oriented x3, affect grossly appropriate.  ECG:  An ECG dated 11/16/2021 was personally reviewed  today and demonstrated:  Sinus rhythm.  Recent Labwork: 11/16/2021: ALT 16; AST 18; B Natriuretic Peptide 72.0; BUN 6; Creatinine, Ser 0.40; Hemoglobin 7.8; Platelets 135; Potassium 3.5; Sodium 141     Component Value Date/Time   CHOL 132 11/16/2021 1123   TRIG 81 11/16/2021 1123   HDL 50 11/16/2021 1123   CHOLHDL 2.6 11/16/2021 1123   LDLCALC 66 11/16/2021 1123    Other Studies Reviewed Today:  Chest x-ray 11/16/2021: IMPRESSION: No acute cardiopulmonary  disease.  Assessment and Plan:  1.  History of exertional chest pain and shortness of breath as discussed above in a 41 year old woman with recently diagnosed hypertension, also severely anemic with hemoglobin down to 5.7 in the setting of reported heavy menses and also an abnormal pelvic ultrasound at recent ER encounter.  She does feel somewhat better after 2 unit PRBC transfusion with hemoglobin up to 7.8.  Also on iron supplements with plan to see gynecologist.  ECG reviewed and normal, chest x-ray also normal.  Screening lipid panel looks good with LDL 66.  No obvious family history of premature CAD.  I suspect that her symptoms may be related to her anemia principally.  Will get an echocardiogram to ensure structurally normal heart particularly with heart murmur (which may just be flow murmur in the setting of anemia).  We will also get a GXT to exclude high risk features, as she may need further procedures to address her GU bleeding source.  Further recommendations to follow.  2.  Essential hypertension, recently started on Norvasc by PCP.  Medication Adjustments/Labs and Tests Ordered: Current medicines are reviewed at length with the patient today.  Concerns regarding medicines are outlined above.   Tests Ordered: Orders Placed This Encounter  Procedures   Exercise Tolerance Test   ECHOCARDIOGRAM COMPLETE    Medication Changes: No orders of the defined types were placed in this encounter.   Disposition:  Follow up  test results.  Signed, Satira Sark, MD, [facility] 11/30/2021 3:22 PM    [facility] [address], [place], [place] [address] Phone: [phone]; Fax: [phone]

## 2021-12-02 NOTE — Progress Notes (Signed)
Pt states she was contacted the other day and was told she did not have to complete stool test since she had one in ER. Please advise is we need to repeat. Thank you

## 2021-12-08 ENCOUNTER — Telehealth: Payer: Self-pay | Admitting: Nurse Practitioner

## 2021-12-08 NOTE — Telephone Encounter (Signed)
Patient dropped another FMLA to be completed. She is asking for extention from  11/26/21 to 12/03/21. Form is in your yellow folder

## 2021-12-09 ENCOUNTER — Ambulatory Visit (HOSPITAL_BASED_OUTPATIENT_CLINIC_OR_DEPARTMENT_OTHER)
Admission: RE | Admit: 2021-12-09 | Discharge: 2021-12-09 | Disposition: A | Discharge status: Home / Self Care | Payer: BC Managed Care – PPO | Source: Ambulatory Visit | Attending: Cardiology | Admitting: Cardiology

## 2021-12-09 ENCOUNTER — Encounter: Payer: Self-pay | Admitting: *Deleted

## 2021-12-09 ENCOUNTER — Ambulatory Visit (HOSPITAL_COMMUNITY)
Admission: RE | Admit: 2021-12-09 | Discharge: 2021-12-09 | Disposition: A | Discharge status: Home / Self Care | Payer: BC Managed Care – PPO | Source: Ambulatory Visit | Attending: Cardiology | Admitting: Cardiology

## 2021-12-09 DIAGNOSIS — R011 Cardiac murmur, unspecified: Secondary | ICD-10-CM | POA: Diagnosis not present

## 2021-12-09 DIAGNOSIS — R0602 Shortness of breath: Secondary | ICD-10-CM | POA: Diagnosis not present

## 2021-12-09 DIAGNOSIS — Z87898 Personal history of other specified conditions: Secondary | ICD-10-CM | POA: Insufficient documentation

## 2021-12-09 LAB — EXERCISE TOLERANCE TEST
Angina Index: 0
Duke Treadmill Score: 4
Estimated workload: 6.4
Exercise duration (min): 3 min
Exercise duration (sec): 45 s
Peak HR: 173 {beats}/min
Percent HR: 97 %
Rest HR: 85 {beats}/min
ST Depression (mm): 0 mm

## 2021-12-09 LAB — ECHOCARDIOGRAM COMPLETE
Area-P 1/2: 3.12 cm2
S' Lateral: 3.4 cm

## 2021-12-09 NOTE — Progress Notes (Signed)
*  PRELIMINARY RESULTS* Echocardiogram 2D Echocardiogram has been performed.  Alexandra Mclaughlin 12/09/2021, 9:06 AM

## 2021-12-22 ENCOUNTER — Telehealth: Payer: Self-pay

## 2021-12-22 ENCOUNTER — Other Ambulatory Visit: Payer: Self-pay | Admitting: Nurse Practitioner

## 2021-12-22 MED ORDER — TRANEXAMIC ACID 650 MG PO TABS: 1300.0000 mg | ORAL_TABLET | Freq: Three times a day (TID) | ORAL | 0 refills | Status: DC

## 2021-12-22 NOTE — Telephone Encounter (Signed)
Pt requesting to speak with [place]. Please advise. Thank you

## 2021-12-22 NOTE — Telephone Encounter (Signed)
Caller name:Fleda Ronnald Ramp   On DPR? :No  Call back number:[phone]  Provider they see: Barbee Shropshire   Reason for call:Pt picked up her FMLA but has questions about her forms and wanted to speak to [place] she would not give anymore information

## 2021-12-26 ENCOUNTER — Ambulatory Visit: Payer: BC Managed Care – PPO | Admitting: Obstetrics & Gynecology

## 2021-12-28 ENCOUNTER — Ambulatory Visit: Payer: BC Managed Care – PPO | Admitting: Nurse Practitioner

## 2022-01-02 ENCOUNTER — Encounter: Payer: Self-pay | Admitting: Neurology

## 2022-01-02 ENCOUNTER — Institutional Professional Consult (permissible substitution): Payer: BC Managed Care – PPO | Admitting: Neurology

## 2022-01-03 ENCOUNTER — Ambulatory Visit: Payer: Self-pay | Admitting: Obstetrics & Gynecology

## 2022-01-03 DIAGNOSIS — Z0289 Encounter for other administrative examinations: Secondary | ICD-10-CM

## 2022-02-06 ENCOUNTER — Ambulatory Visit: Payer: Self-pay | Admitting: Obstetrics & Gynecology

## 2022-02-06 DIAGNOSIS — Z539 Procedure and treatment not carried out, unspecified reason: Secondary | ICD-10-CM

## 2022-02-17 ENCOUNTER — Other Ambulatory Visit: Payer: Self-pay | Admitting: *Deleted

## 2022-02-17 ENCOUNTER — Telehealth: Payer: Self-pay | Admitting: Nurse Practitioner

## 2022-02-17 ENCOUNTER — Telehealth: Payer: Self-pay | Admitting: *Deleted

## 2022-02-17 NOTE — Telephone Encounter (Signed)
Pt requesting refill on TRANEXAMIC ACID. Not currently on our med list but if you reconcile medications it will pop up. Please advise. Thank you  [facility].

## 2022-02-17 NOTE — Telephone Encounter (Signed)
[facility] sent in RF request for Tranex '650mg'$  - take 2 tabs TID for 5 days   Last fille 10/17/23Barbee Shropshire NP

## 2022-02-20 ENCOUNTER — Other Ambulatory Visit: Payer: Self-pay | Admitting: Nurse Practitioner

## 2022-02-20 MED ORDER — TRANEXAMIC ACID 650 MG PO TABS: 1300.0000 mg | ORAL_TABLET | Freq: Three times a day (TID) | ORAL | 0 refills | Status: AC

## 2022-02-21 NOTE — Telephone Encounter (Signed)
[facility]ite, Trenton Gammon, FNP     Medication refilled.  Barbee Shropshire

## 2022-03-03 ENCOUNTER — Ambulatory Visit: Payer: BC Managed Care – PPO | Admitting: Family Medicine

## 2022-03-14 ENCOUNTER — Telehealth: Payer: Self-pay | Admitting: Family Medicine

## 2022-03-14 ENCOUNTER — Other Ambulatory Visit: Payer: Self-pay

## 2022-03-14 DIAGNOSIS — I10 Essential (primary) hypertension: Secondary | ICD-10-CM

## 2022-03-14 MED ORDER — AMLODIPINE BESYLATE 5 MG PO TABS: 5.0000 mg | ORAL_TABLET | Freq: Every day | ORAL | 0 refills | Status: DC

## 2022-03-14 NOTE — Telephone Encounter (Signed)
Pt requesting refill on Tranexamic Acid. Please advise. Thank you [facility].

## 2022-03-15 ENCOUNTER — Other Ambulatory Visit: Payer: Self-pay | Admitting: Family Medicine

## 2022-03-15 MED ORDER — TRANEXAMIC ACID 650 MG PO TABS: 1300.0000 mg | ORAL_TABLET | Freq: Three times a day (TID) | ORAL | 0 refills | Status: AC

## 2022-03-15 NOTE — Telephone Encounter (Signed)
Medication on historical med list; last filled by [place]. Please advise. Thank you

## 2022-03-15 NOTE — Telephone Encounter (Signed)
Pt contacted and verbalized understanding.  

## 2022-03-17 ENCOUNTER — Encounter: Payer: BC Managed Care – PPO | Admitting: Obstetrics & Gynecology

## 2022-03-20 ENCOUNTER — Ambulatory Visit: Payer: BC Managed Care – PPO | Admitting: Family Medicine

## 2022-03-24 ENCOUNTER — Other Ambulatory Visit: Payer: Self-pay

## 2022-03-24 DIAGNOSIS — D500 Iron deficiency anemia secondary to blood loss (chronic): Secondary | ICD-10-CM

## 2022-03-24 MED ORDER — FERROUS SULFATE 325 (65 FE) MG PO TABS: 325.0000 mg | ORAL_TABLET | Freq: Every day | ORAL | 1 refills | Status: DC

## 2022-04-06 ENCOUNTER — Other Ambulatory Visit: Payer: Self-pay | Admitting: Family Medicine

## 2022-04-07 ENCOUNTER — Other Ambulatory Visit: Payer: Self-pay | Admitting: Family Medicine

## 2022-04-07 ENCOUNTER — Telehealth: Payer: Self-pay | Admitting: Family Medicine

## 2022-04-07 ENCOUNTER — Ambulatory Visit: Payer: BC Managed Care – PPO | Admitting: Family Medicine

## 2022-04-07 NOTE — Telephone Encounter (Signed)
Pt requesting refill on Tranexamic Acid-takes 2 tabs TID. Not on current med list but is on historical; last filled in Dec 2022. Had appt today but cancelled; on schedule for next Friday.(You have not seen this pt yet) Please advise. Thank you  [facility].

## 2022-04-09 ENCOUNTER — Other Ambulatory Visit: Payer: Self-pay | Admitting: Family Medicine

## 2022-04-09 MED ORDER — TRANEXAMIC ACID 650 MG PO TABS: 1300.0000 mg | ORAL_TABLET | Freq: Three times a day (TID) | ORAL | 0 refills | Status: AC

## 2022-04-14 ENCOUNTER — Ambulatory Visit (INDEPENDENT_AMBULATORY_CARE_PROVIDER_SITE_OTHER): Payer: BC Managed Care – PPO | Admitting: Family Medicine

## 2022-04-14 DIAGNOSIS — I10 Essential (primary) hypertension: Secondary | ICD-10-CM | POA: Diagnosis not present

## 2022-04-14 DIAGNOSIS — D500 Iron deficiency anemia secondary to blood loss (chronic): Secondary | ICD-10-CM | POA: Diagnosis not present

## 2022-04-14 MED ORDER — AMLODIPINE BESYLATE 5 MG PO TABS: 5.0000 mg | ORAL_TABLET | Freq: Every day | ORAL | 3 refills | Status: DC

## 2022-04-14 NOTE — Patient Instructions (Signed)
Continue your medication.  Labs today.  Follow up in 3 months.

## 2022-04-16 DIAGNOSIS — D500 Iron deficiency anemia secondary to blood loss (chronic): Secondary | ICD-10-CM | POA: Insufficient documentation

## 2022-04-16 NOTE — Assessment & Plan Note (Signed)
Stable. Continue Norvasc. Refilled today.

## 2022-04-16 NOTE — Progress Notes (Signed)
Subjective:  Patient ID: Alexandra Mclaughlin, female    DOB: 01-Sep-1980  Age: 42 y.o. MRN: [id number]  CC: Chief Complaint  Patient presents with   Follow-up    For blood pressure    HPI:  42 year old female with a history of hypertension and iron deficiency anemia secondary to chronic blood loss (from menorrhagia in the setting of fibroid) presents for follow up.  Hypertension is stable on amlodipine. Needs refill.  Has severe underlying anemia. Is currently on PO iron. Has not had follow up labs since August. Reports significant fatigue.  Patient Active Problem List   Diagnosis Date Noted   Iron deficiency anemia due to chronic blood loss 04/16/2022   Primary hypertension 11/16/2021   Uterine fibroid 02/09/2014    Social Hx   Social History   Socioeconomic History   Marital status: Married    Spouse name: Not on file   Number of children: Not on file   Years of education: Not on file   Highest education level: Not on file  Occupational History   Not on file  Tobacco Use   Smoking status: Former   Smokeless tobacco: Never  Substance and Sexual Activity   Alcohol use: No   Drug use: No   Sexual activity: Yes    Birth control/protection: None  Other Topics Concern   Not on file  Social History Narrative   Not on file   Social Determinants of Health   Financial Resource Strain: Not on file  Food Insecurity: Not on file  Transportation Needs: Not on file  Physical Activity: Not on file  Stress: Not on file  Social Connections: Not on file    Review of Systems Per HPI  Objective:  BP 137/83   Pulse 92   Temp 97.9 F (36.6 C) (Oral)   Ht '5\' 4"'$  (1.626 m)   Wt 227 lb 12.8 oz (103.3 kg)   SpO2 100%   BMI 39.10 kg/m      04/14/2022   11:07 AM 04/14/2022   11:02 AM 11/30/2021    2:39 PM  BP/Weight  Systolic BP 176 160 737  Diastolic BP 83 89 68  Wt. (Lbs)  227.8 213.8  BMI  39.1 kg/m2 36.7 kg/m2    Physical Exam Constitutional:      General:  She is not in acute distress.    Appearance: Normal appearance.  HENT:     Head: Normocephalic and atraumatic.  Eyes:     General:        Right eye: No discharge.        Left eye: No discharge.     Conjunctiva/sclera: Conjunctivae normal.  Cardiovascular:     Rate and Rhythm: Normal rate and regular rhythm.     Heart sounds: Murmur heard.  Pulmonary:     Effort: Pulmonary effort is normal.     Breath sounds: Normal breath sounds.  Neurological:     Mental Status: She is alert.     Lab Results  Component Value Date   WBC 4.9 11/16/2021   HGB 7.8 (L) 11/16/2021   HCT 23.0 (L) 11/16/2021   PLT 135 (L) 11/16/2021   GLUCOSE 106 (H) 11/16/2021   CHOL 132 11/16/2021   TRIG 81 11/16/2021   HDL 50 11/16/2021   LDLCALC 66 11/16/2021   ALT 16 11/16/2021   AST 18 11/16/2021   NA 141 11/16/2021   K 3.5 11/16/2021   CL 107 11/16/2021   CREATININE 0.40 (L) 11/16/2021  BUN 6 11/16/2021   CO2 23 11/16/2021   INR 1.1 11/16/2021   HGBA1C 5.4 11/16/2021     Assessment & Plan:   Problem List Items Addressed This Visit       Cardiovascular and Mediastinum   Primary hypertension    Stable. Continue Norvasc. Refilled today.      Relevant Medications   amLODipine (NORVASC) 5 MG tablet     Other   Iron deficiency anemia due to chronic blood loss    Unsure of current status. Labs ordered. Continue Iron therapy. Has upcoming appt with GYN.      Relevant Orders   CBC   Iron, TIBC and Ferritin Panel    Meds ordered this encounter  Medications   amLODipine (NORVASC) 5 MG tablet    Sig: Take 1 tablet (5 mg total) by mouth daily.    Dispense:  90 tablet    Refill:  3    Follow-up:  Return in about 3 months (around 07/14/2022).  [place]

## 2022-04-16 NOTE — Assessment & Plan Note (Signed)
Unsure of current status. Labs ordered. Continue Iron therapy. Has upcoming appt with GYN.

## 2022-04-17 ENCOUNTER — Encounter: Payer: BC Managed Care – PPO | Admitting: Obstetrics & Gynecology

## 2022-05-10 ENCOUNTER — Other Ambulatory Visit: Payer: Self-pay

## 2022-05-10 ENCOUNTER — Telehealth: Payer: Self-pay | Admitting: Family Medicine

## 2022-05-10 DIAGNOSIS — D500 Iron deficiency anemia secondary to blood loss (chronic): Secondary | ICD-10-CM

## 2022-05-10 DIAGNOSIS — I10 Essential (primary) hypertension: Secondary | ICD-10-CM

## 2022-05-10 MED ORDER — AMLODIPINE BESYLATE 5 MG PO TABS: 5.0000 mg | ORAL_TABLET | Freq: Every day | ORAL | 3 refills | Status: DC

## 2022-05-10 MED ORDER — FERROUS SULFATE 325 (65 FE) MG PO TABS: 325.0000 mg | ORAL_TABLET | Freq: Every day | ORAL | 1 refills | Status: AC

## 2022-05-10 NOTE — Telephone Encounter (Signed)
Pt requesting refill on Tranexamic acid 650 mg- take 2 tablets po TID. Please advise. Thank you  [facility].

## 2022-05-11 ENCOUNTER — Other Ambulatory Visit: Payer: Self-pay | Admitting: Family Medicine

## 2022-05-11 NOTE — Telephone Encounter (Signed)
Left message to return call.  

## 2022-05-16 NOTE — Telephone Encounter (Signed)
Left message to return call.  

## 2022-05-21 ENCOUNTER — Other Ambulatory Visit: Payer: Self-pay | Admitting: Family Medicine

## 2022-05-22 NOTE — Telephone Encounter (Signed)
Mychart message sent to patient.

## 2022-05-23 ENCOUNTER — Other Ambulatory Visit: Payer: Self-pay | Admitting: Family Medicine

## 2022-05-23 ENCOUNTER — Telehealth: Payer: Self-pay

## 2022-05-23 NOTE — Telephone Encounter (Signed)
Patient notified and verbalized understanding Patient to contact GYN

## 2022-05-23 NOTE — Telephone Encounter (Signed)
See Message from Dr Lacinda Axon 05/11/2022:   Coral Spikes, DO     05/11/22  8:14 AM This medication is only for short term use. Needs to see GYN. Needs to get labs done as well.

## 2022-05-23 NOTE — Telephone Encounter (Signed)
Pt said she needs refill for ranexamic acid (LYSTEDA) 650 MG TABS tablet (Expired)   Pt uses [facility] if this medication can be refilled   Pt call back [phone]

## 2022-05-25 ENCOUNTER — Encounter: Payer: Self-pay | Admitting: Adult Health

## 2022-05-25 ENCOUNTER — Ambulatory Visit: Payer: BC Managed Care – PPO | Admitting: Adult Health

## 2022-05-25 VITALS — BP 132/77 | HR 80 | Ht 64.0 in | Wt 235.0 lb

## 2022-05-25 DIAGNOSIS — D500 Iron deficiency anemia secondary to blood loss (chronic): Secondary | ICD-10-CM | POA: Diagnosis not present

## 2022-05-25 DIAGNOSIS — N920 Excessive and frequent menstruation with regular cycle: Secondary | ICD-10-CM

## 2022-05-25 DIAGNOSIS — D259 Leiomyoma of uterus, unspecified: Secondary | ICD-10-CM

## 2022-05-25 LAB — POCT HEMOGLOBIN: Hemoglobin: 9 g/dL — AB (ref 11–14.6)

## 2022-05-25 MED ORDER — TRANEXAMIC ACID 650 MG PO TABS: 1300.0000 mg | ORAL_TABLET | Freq: Three times a day (TID) | ORAL | 1 refills | Status: DC

## 2022-05-25 NOTE — Progress Notes (Signed)
Subjective:     Patient ID: Alexandra Mclaughlin, female   DOB: [birth year], 42 y.o.   MRN: [id number]  Alexandra Mclaughlin is a 42 year old black female,married, G2P1011, in complaining of heavy periods for 2 years unusually last about 5 days with cramps and changes pads every 3 hours, but last period lasted 8 days. She has know fibroids, and had to get blood transfusion last year.  She has lysteda from Dr Lacinda Axon but does not take regularly, nor does she take her iron regularly. She is tired.her last pap was negative in 2015. She may want a baby.  PCP is Dr Lacinda Axon  Review of Systems +heavy periods for 2 years unusually last about 5 days with cramps and changes pads every 3 hours, but last period lasted 8 days   +tired. Reviewed past medical,surgical, social and family history. Reviewed medications and allergies.  Objective:   Physical Exam BP 132/77 (BP Location: Left Arm, Patient Position: Sitting, Cuff Size: Large)   Pulse 80   Ht 5' 4"$  (1.626 m)   Wt 235 lb (106.6 kg)   LMP 05/04/2022 (Approximate)   Breastfeeding No   BMI 40.34 kg/m  POC HGB 9 Skin warm and dry.  Lungs: clear to ausculation bilaterally. Cardiovascular: regular rate and rhythm.    She refuses pelvic exam today. [place] 2023:      Uterus   Measurements: 17.3 x 9.0 x 14.7 cm = volume: 1203 mL. Extremely bulky, heterogeneous fibroid uterus. Largest fibroid in the lower uterine segment has a large submucosal component and measures at least 7.8 cm.   Endometrium   Endometrial stripe is distorted and effaced by fibroids, candidate segment measures up 1.0 cm in thickness.   Right ovary   Measurements: 4.3 x 3.3 x 1.9 cm = volume: 14 mL. Normal appearance/no adnexal mass.   Left ovary   Measurements: 4.5 x 4.1 x 4 2 cm = volume: 40 mL. Normal appearance/no adnexal mass.   Other findings   No abnormal free fluid.   IMPRESSION: 1. Extremely bulky fibroid uterus. Multiple submucosal fibroids, largest submucosal fibroid in  the lower uterine segment measures at least 7.8 cm. 2. Endometrial stripe is distorted and effaced by fibroids, Candida segment visualized measuring up to 1.0 cm thickness without obvious focal abnormality. MRI may be helpful to further evaluate.  AA is 0 Fall risk is low    05/25/2022    9:33 AM 04/14/2022   11:06 AM 11/16/2021   10:17 AM  Depression screen PHQ 2/9  Decreased Interest 0 0 0  Down, Depressed, Hopeless 0 0 0  PHQ - 2 Score 0 0 0  Altered sleeping 0 3   Tired, decreased energy 2 2   Change in appetite 0 0   Feeling bad or failure about yourself  0 0   Trouble concentrating 0 0   Moving slowly or fidgety/restless 0 0   Suicidal thoughts 0 0   PHQ-9 Score 2 5   Difficult doing work/chores  Not difficult at all        05/25/2022    9:33 AM 04/14/2022   11:06 AM  GAD 7 : Generalized Anxiety Score  Nervous, Anxious, on Edge 0 0  Control/stop worrying 0 0  Worry too much - different things 0 0  Trouble relaxing 0 0  Restless 0 0  Easily annoyed or irritable 0 0  Afraid - awful might happen 0 0  Total GAD 7 Score 0 0  Anxiety Difficulty  Not difficult at all      Upstream - 05/25/22 [id number]       Pregnancy Intention Screening   Does the patient want to become pregnant in the next year? Yes    Does the patient's partner want to become pregnant in the next year? Yes    Would the patient like to discuss contraceptive options today? No      Contraception Wrap Up   Current Method Pregnant/Seeking Pregnancy    End Method Pregnant/Seeking Pregnancy             Plan:    Impression and plan:  1. Menorrhagia with regular cycle Bleeding heavy for 2 years Will refill lysteda and please take  Meds ordered this encounter  Medications   tranexamic acid (LYSTEDA) 650 MG TABS tablet    Sig: Take 2 tablets (1,300 mg total) by mouth 3 (three) times daily. X 5 days    Dispense:  30 tablet    Refill:  1    Order Specific Question:   Supervising Provider    Answer:    Elonda Husky, LUTHER H [[id number]]     2. Uterine leiomyoma, unspecified location Discussed with fibroids could affect getting pregnant and fetal growth  Review handout on fibroids   3. Iron deficiency anemia due to chronic blood loss POC HGB 9, take iron every day    Return 06/09/22 for pap and physical with me

## 2022-06-09 ENCOUNTER — Ambulatory Visit: Payer: BC Managed Care – PPO | Admitting: Adult Health

## 2022-06-29 ENCOUNTER — Ambulatory Visit: Payer: BC Managed Care – PPO | Admitting: Adult Health

## 2022-07-14 ENCOUNTER — Encounter: Payer: Self-pay | Admitting: Family Medicine

## 2022-07-14 ENCOUNTER — Ambulatory Visit: Payer: BC Managed Care – PPO | Admitting: Family Medicine

## 2022-08-08 ENCOUNTER — Other Ambulatory Visit: Payer: Self-pay | Admitting: Adult Health

## 2022-08-18 ENCOUNTER — Ambulatory Visit: Payer: BC Managed Care – PPO | Admitting: Adult Health

## 2022-08-21 ENCOUNTER — Ambulatory Visit: Payer: BC Managed Care – PPO | Admitting: Adult Health

## 2022-09-01 ENCOUNTER — Ambulatory Visit: Payer: BC Managed Care – PPO | Admitting: Adult Health

## 2022-09-18 DIAGNOSIS — M25461 Effusion, right knee: Secondary | ICD-10-CM | POA: Diagnosis not present

## 2022-09-18 DIAGNOSIS — E669 Obesity, unspecified: Secondary | ICD-10-CM | POA: Diagnosis not present

## 2022-09-18 DIAGNOSIS — Z6838 Body mass index (BMI) 38.0-38.9, adult: Secondary | ICD-10-CM | POA: Diagnosis not present

## 2022-09-19 ENCOUNTER — Other Ambulatory Visit: Payer: Self-pay | Admitting: Adult Health

## 2022-10-17 ENCOUNTER — Other Ambulatory Visit: Payer: Self-pay | Admitting: Adult Health

## 2022-11-15 ENCOUNTER — Telehealth: Payer: Self-pay

## 2022-11-15 ENCOUNTER — Other Ambulatory Visit: Payer: Self-pay | Admitting: Adult Health

## 2022-11-15 NOTE — Telephone Encounter (Signed)
Called patient to get scheduled for pap/physical per Cyril Mourning. Left voicemail message to call office to get set up.

## 2022-12-15 ENCOUNTER — Other Ambulatory Visit: Payer: Self-pay | Admitting: Adult Health

## 2023-01-15 ENCOUNTER — Ambulatory Visit: Payer: BC Managed Care – PPO | Admitting: Adult Health

## 2023-01-31 ENCOUNTER — Other Ambulatory Visit: Payer: Self-pay | Admitting: Adult Health

## 2023-02-07 ENCOUNTER — Other Ambulatory Visit: Payer: Self-pay | Admitting: Adult Health

## 2023-02-16 ENCOUNTER — Ambulatory Visit: Payer: BC Managed Care – PPO | Admitting: Adult Health

## 2023-02-22 ENCOUNTER — Ambulatory Visit: Payer: BC Managed Care – PPO | Admitting: Adult Health

## 2023-02-23 ENCOUNTER — Ambulatory Visit: Payer: BC Managed Care – PPO | Admitting: Adult Health

## 2023-02-23 ENCOUNTER — Other Ambulatory Visit (HOSPITAL_COMMUNITY)
Admission: RE | Admit: 2023-02-23 | Discharge: 2023-02-23 | Disposition: A | Discharge status: Home / Self Care | Payer: BC Managed Care – PPO | Source: Ambulatory Visit | Attending: Adult Health | Admitting: Adult Health

## 2023-02-23 ENCOUNTER — Encounter: Payer: Self-pay | Admitting: Adult Health

## 2023-02-23 VITALS — BP 141/85 | HR 96 | Ht 64.0 in | Wt 236.0 lb

## 2023-02-23 DIAGNOSIS — N920 Excessive and frequent menstruation with regular cycle: Secondary | ICD-10-CM

## 2023-02-23 DIAGNOSIS — Z124 Encounter for screening for malignant neoplasm of cervix: Secondary | ICD-10-CM | POA: Insufficient documentation

## 2023-02-23 DIAGNOSIS — Z862 Personal history of diseases of the blood and blood-forming organs and certain disorders involving the immune mechanism: Secondary | ICD-10-CM

## 2023-02-23 DIAGNOSIS — I10 Essential (primary) hypertension: Secondary | ICD-10-CM

## 2023-02-23 DIAGNOSIS — Z319 Encounter for procreative management, unspecified: Secondary | ICD-10-CM

## 2023-02-23 DIAGNOSIS — D259 Leiomyoma of uterus, unspecified: Secondary | ICD-10-CM | POA: Diagnosis not present

## 2023-02-23 MED ORDER — AMLODIPINE BESYLATE 5 MG PO TABS: 5.0000 mg | ORAL_TABLET | Freq: Every day | ORAL | 3 refills | Status: DC

## 2023-02-23 MED ORDER — TRANEXAMIC ACID 650 MG PO TABS: 1300.0000 mg | ORAL_TABLET | Freq: Three times a day (TID) | ORAL | 3 refills | Status: DC

## 2023-02-23 NOTE — Progress Notes (Signed)
  Subjective:     Patient ID: Alexandra Mclaughlin, female   DOB: Aug 04, 1980, 42 y.o.   MRN: [id number]  HPI Frederick is a 42 year old black female, married, G2P1011, in complaining of continued heavy periods, they are regular and last 5-6 days, will change pads every 3 hours or so. She is out of lysteda, it may help some. She is still wanting to get pregnant, her son is 45 now.  She has known fibroids.  She needs a pap too. No current PCP, she says and has not taken BP in about a week.   Review of Systems +heavy periods Lightheaded at times, has not taken BP in about a week and has history of anemia Reviewed past medical,surgical, social and family history. Reviewed medications and allergies.     Objective:   Physical Exam BP (!) 141/85 (BP Location: Left Arm, Patient Position: Sitting, Cuff Size: Large)   Pulse 96   Ht 5\' 4"  (1.626 m)   Wt 236 lb (107 kg)   LMP 02/05/2023   BMI 40.51 kg/m     Skin warm and dry.  Lungs: clear to ausculation bilaterally. Cardiovascular: regular rate and rhythm. Pelvic: external genitalia is normal in appearance no lesions, vagina is pink and moist,urethra has no lesions or masses noted, cervix:smooth and bulbous,pap with HR HPV genotyping performed,  uterus: is enlarged at least 24 week size, with several fibroids palpated,uterus is non tender, no masses felt, adnexa: no masses or tenderness noted. Bladder is non tender and no masses felt.   Upstream - 02/23/23 1027       Pregnancy Intention Screening   Does the patient want to become pregnant in the next year? Yes    Does the patient's partner want to become pregnant in the next year? Yes    Would the patient like to discuss contraceptive options today? No      Contraception Wrap Up   Current Method Pregnant/Seeking Pregnancy    End Method Pregnant/Seeking Pregnancy    Contraception Counseling Provided No            Examination chaperoned by Faith Rogue LPN  Assessment:     1. Routine  cervical smear Pap sent - Cytology - PAP( [facility])  2. Menorrhagia with regular cycle Periods are heavy for about 5 days, may change pads every 3 hours  3. Uterine leiomyoma, unspecified location Uterus is enlarged and has multiple fiborids   4. History of anemia Check CBC and Iron panel Take iron and MV daily  - CBC - Iron, TIBC and Ferritin Panel  5. Primary hypertension Has been out of BP meds for over a week No current PCP Will refill norvasc 5 mg 1 daily, and watch salt and sugar  - amLODipine (NORVASC) 5 MG tablet; Take 1 tablet (5 mg total) by mouth daily.  Dispense: 90 tablet; Refill: 3  6. Patient desires pregnancy Discussed with Dr Despina Hidden, will refer to RE[facility]     Plan:     Follow up in 3 months for ROS and BP check  Get PCP

## 2023-02-27 LAB — CYTOLOGY - PAP
Adequacy: ABSENT
Comment: NEGATIVE
Diagnosis: NEGATIVE
High risk HPV: NEGATIVE

## 2023-05-25 ENCOUNTER — Ambulatory Visit: Payer: BC Managed Care – PPO | Admitting: Adult Health

## 2023-05-28 ENCOUNTER — Other Ambulatory Visit: Payer: Self-pay | Admitting: Adult Health

## 2023-06-08 ENCOUNTER — Ambulatory Visit: Payer: BC Managed Care – PPO | Admitting: Adult Health

## 2023-07-23 ENCOUNTER — Ambulatory Visit: Admitting: Adult Health

## 2023-08-03 ENCOUNTER — Ambulatory Visit: Admitting: Adult Health

## 2023-08-16 ENCOUNTER — Ambulatory Visit: Admitting: Adult Health

## 2023-08-16 ENCOUNTER — Encounter: Payer: Self-pay | Admitting: Adult Health

## 2023-08-16 VITALS — BP 132/75 | Ht 65.0 in | Wt 233.5 lb

## 2023-08-16 DIAGNOSIS — N921 Excessive and frequent menstruation with irregular cycle: Secondary | ICD-10-CM

## 2023-08-16 DIAGNOSIS — Z862 Personal history of diseases of the blood and blood-forming organs and certain disorders involving the immune mechanism: Secondary | ICD-10-CM | POA: Diagnosis not present

## 2023-08-16 DIAGNOSIS — D259 Leiomyoma of uterus, unspecified: Secondary | ICD-10-CM | POA: Diagnosis not present

## 2023-08-16 MED ORDER — TRANEXAMIC ACID 650 MG PO TABS
1300.0000 mg | ORAL_TABLET | Freq: Three times a day (TID) | ORAL | 1 refills | Status: DC
Start: 2023-08-16 — End: 2023-10-04

## 2023-08-16 NOTE — Progress Notes (Signed)
  Subjective:     Patient ID: Alexandra Mclaughlin, female   DOB: 07-02-1980, 43 y.o.   MRN: [id number]  HPI Alexandra Mclaughlin is a 43 year old black female, married, G2P1011, in complaining of having period for 4 weeks last month and was heavy, would soak 2-3 pads every 2 hours with cramps too. She was taking lysteda  and it helps a little, she was supposed to have CBC and iron checked but did not get done. She has known fibroids.     Component Value Date/Time   DIAGPAP  02/23/2023 1028    - Negative for intraepithelial lesion or malignancy (NILM)   HPVHIGH Negative 02/23/2023 1028   ADEQPAP  02/23/2023 1028    Satisfactory for evaluation; transformation zone component ABSENT.    Review of Systems  +having period for 4 weeks last month and was heavy, would soak 2-3 pads every 2 hours with cramps too. Reviewed past medical,surgical, social and family history. Reviewed medications and allergies.     Objective:   Physical Exam BP 132/75 (BP Location: Left Arm, Patient Position: Sitting, Cuff Size: Large)   Ht 5\' 5"  (1.651 m)   Wt 233 lb 8 oz (105.9 kg)   LMP 08/10/2023 (Approximate)   BMI 38.86 kg/m     Skin warm and dry. Lungs: clear to ausculation bilaterally. Cardiovascular: regular rate and rhythm.  Fall risk is moderate  Upstream - 08/16/23 1055       Pregnancy Intention Screening   Does the patient want to become pregnant in the next year? No    Does the patient's partner want to become pregnant in the next year? No    Would the patient like to discuss contraceptive options today? No      Contraception Wrap Up   Current Method Withdrawal or Other Method    End Method Withdrawal or Other Method    Contraception Counseling Provided No             Assessment:     1. Menorrhagia with irregular cycle (Primary)  +having period for 4 weeks last month and was heavy, would soak 2-3 pads every 2 hours with cramps too. Will refill lysteda  Meds ordered this encounter  Medications    tranexamic acid  (LYSTEDA ) 650 MG TABS tablet    Sig: Take 2 tablets (1,300 mg total) by mouth 3 (three) times daily.    Dispense:  30 tablet    Refill:  1    Supervising Provider:   Wendelyn Halter [[id number]]   Will check CBC and iron panel  Will get US  to assess uterus, scheduled for 08/24/23 at 2:30 pm at [facility]  - US  PELVIC COMPLETE WITH TRANSVAGINAL; Future - CBC - Iron, TIBC and Ferritin Panel  2. History of anemia Check labs  - CBC - Iron, TIBC and Ferritin Panel  3. Uterine leiomyoma, unspecified location Will get US  to assess for any growth - US  PELVIC COMPLETE WITH TRANSVAGINAL; Future     Plan:     Follow up in 2 weeks for ROS and review US

## 2023-08-24 ENCOUNTER — Other Ambulatory Visit: Payer: Self-pay | Admitting: Adult Health

## 2023-08-24 ENCOUNTER — Ambulatory Visit (HOSPITAL_COMMUNITY)
Admission: RE | Admit: 2023-08-24 | Discharge: 2023-08-24 | Disposition: A | Discharge status: Home / Self Care | Source: Ambulatory Visit | Attending: Adult Health | Admitting: Adult Health

## 2023-08-24 DIAGNOSIS — D251 Intramural leiomyoma of uterus: Secondary | ICD-10-CM | POA: Diagnosis not present

## 2023-08-24 DIAGNOSIS — D259 Leiomyoma of uterus, unspecified: Secondary | ICD-10-CM | POA: Diagnosis not present

## 2023-08-24 DIAGNOSIS — N920 Excessive and frequent menstruation with regular cycle: Secondary | ICD-10-CM | POA: Diagnosis not present

## 2023-08-24 DIAGNOSIS — Z862 Personal history of diseases of the blood and blood-forming organs and certain disorders involving the immune mechanism: Secondary | ICD-10-CM

## 2023-08-24 DIAGNOSIS — N921 Excessive and frequent menstruation with irregular cycle: Secondary | ICD-10-CM | POA: Insufficient documentation

## 2023-08-24 DIAGNOSIS — N852 Hypertrophy of uterus: Secondary | ICD-10-CM | POA: Diagnosis not present

## 2023-09-05 ENCOUNTER — Ambulatory Visit: Payer: Self-pay | Admitting: Adult Health

## 2023-09-05 DIAGNOSIS — D259 Leiomyoma of uterus, unspecified: Secondary | ICD-10-CM

## 2023-09-05 NOTE — Telephone Encounter (Signed)
 Mailbox is full.

## 2023-09-06 ENCOUNTER — Telehealth: Payer: Self-pay | Admitting: Adult Health

## 2023-09-06 NOTE — Telephone Encounter (Signed)
 Pt aware of US  and that fibroids have gotten bigger, will get appt with Dr Ozan 09/24/23 at 10:10 am to discuss options and possible surgery, she prefers female provider.

## 2023-09-23 ENCOUNTER — Emergency Department (HOSPITAL_COMMUNITY)

## 2023-09-23 ENCOUNTER — Encounter (HOSPITAL_COMMUNITY): Payer: Self-pay | Admitting: *Deleted

## 2023-09-23 ENCOUNTER — Other Ambulatory Visit: Payer: Self-pay

## 2023-09-23 ENCOUNTER — Emergency Department (HOSPITAL_COMMUNITY)
Admission: EM | Admit: 2023-09-23 | Discharge: 2023-09-23 | Disposition: A | Discharge status: Home / Self Care | Attending: Emergency Medicine | Admitting: Emergency Medicine

## 2023-09-23 DIAGNOSIS — J209 Acute bronchitis, unspecified: Secondary | ICD-10-CM | POA: Insufficient documentation

## 2023-09-23 DIAGNOSIS — R0989 Other specified symptoms and signs involving the circulatory and respiratory systems: Secondary | ICD-10-CM | POA: Diagnosis not present

## 2023-09-23 DIAGNOSIS — I10 Essential (primary) hypertension: Secondary | ICD-10-CM | POA: Insufficient documentation

## 2023-09-23 DIAGNOSIS — Z79899 Other long term (current) drug therapy: Secondary | ICD-10-CM | POA: Diagnosis not present

## 2023-09-23 DIAGNOSIS — R059 Cough, unspecified: Secondary | ICD-10-CM | POA: Diagnosis not present

## 2023-09-23 DIAGNOSIS — R000 Tachycardia, unspecified: Secondary | ICD-10-CM | POA: Diagnosis not present

## 2023-09-23 LAB — RESP PANEL BY RT-PCR (RSV, FLU A&B, COVID)  RVPGX2
Influenza A by PCR: NEGATIVE
Influenza B by PCR: NEGATIVE
Resp Syncytial Virus by PCR: NEGATIVE
SARS Coronavirus 2 by RT PCR: NEGATIVE

## 2023-09-23 MED ORDER — CORICIDIN HBP COLD/COUGH/FLU 20-400-650 MG/30ML PO LIQD: 5.0000 mL | Freq: Two times a day (BID) | ORAL | 0 refills | Status: AC | PRN

## 2023-09-23 MED ORDER — GUAIFENESIN 100 MG/5ML PO LIQD
5.0000 mL | Freq: Once | ORAL | Status: AC
  Administered 2023-09-23: 5 mL via ORAL
  Filled 2023-09-23: qty 5

## 2023-09-23 MED ORDER — BENZONATATE 100 MG PO CAPS: 100.0000 mg | ORAL_CAPSULE | Freq: Three times a day (TID) | ORAL | 0 refills | Status: AC

## 2023-09-23 MED ORDER — DOXYCYCLINE HYCLATE 100 MG PO CAPS: 100.0000 mg | ORAL_CAPSULE | Freq: Two times a day (BID) | ORAL | 0 refills | Status: AC

## 2023-09-23 MED ORDER — DOXYCYCLINE HYCLATE 100 MG PO TABS
100.0000 mg | ORAL_TABLET | Freq: Once | ORAL | Status: AC
  Administered 2023-09-23: 100 mg via ORAL
  Filled 2023-09-23: qty 1

## 2023-09-23 NOTE — ED Triage Notes (Signed)
 Pt with coughing x 2 weeks, coughing is so hard at times causing pt to vomit. Productive cough

## 2023-09-23 NOTE — ED Provider Notes (Signed)
 Alexandra Mclaughlin, Alexandra Mclaughlin is a 43 y.o. female.   Pt is a 43 yo female with pmhx significant htn and anemia.  Pt has had a productive cough for the past 2 weeks.  She said she sometimes coughs so hard that she vomits.  She has been able to keep down fluids.  She denies cp.  No fevers.  No known sick contacts.  She has taken nothing for her sx.       Prior to Admission medications   Medication Sig Start Date End Date Taking? Authorizing Provider  benzonatate (TESSALON) 100 MG capsule Take 1 capsule (100 mg total) by mouth every 8 (eight) hours. 09/23/23  Yes Dean Clarity, MD  Dextromethorphan-GG-APAP (CORICIDIN HBP COLD/COUGH/FLU) 10-200-325 MG/15ML LIQD Take 5 mLs by mouth 2 (two) times daily as needed (cough). 09/23/23  Yes Dean Clarity, MD  doxycycline  (VIBRAMYCIN ) 100 MG capsule Take 1 capsule (100 mg total) by mouth 2 (two) times daily. 09/23/23  Yes Dean Clarity, MD  Acetaminophen  (MIDOL  PO) Take by mouth.    [provider]  acetaminophen  (TYLENOL ) 500 MG tablet Take 1,000 mg by mouth every 6 (six) hours as needed. Pain    [provider]  amLODipine  (NORVASC ) 5 MG tablet Take 1 tablet (5 mg total) by mouth daily. 02/23/23   Signa Delon LABOR, NP  ferrous sulfate  325 (65 FE) MG tablet Take 1 tablet (325 mg total) by mouth daily. Patient not taking: Reported on 08/16/2023 05/10/22   Cook, Jayce G, DO  tranexamic acid  (LYSTEDA ) 650 MG TABS tablet Take 2 tablets (1,300 mg total) by mouth 3 (three) times daily. 08/16/23   Signa Delon LABOR, NP    Allergies: Patient has no known allergies.    Review of Systems  Respiratory:  Positive for cough.   All other systems reviewed and are negative.   Updated Vital Signs BP (!) 142/66 (BP Location: Right Arm)   Pulse (!) 111   Temp 98.7 F (37.1 C) (Oral)    Resp 16   Ht 5' 5 (1.651 m)   Wt 104.3 kg   LMP 07/31/2023 Comment: irregular periods  SpO2 97%   BMI 38.27 kg/m   Physical Exam Vitals and nursing note reviewed.  Constitutional:      Appearance: Normal appearance. She is obese.  HENT:     Head: Normocephalic and atraumatic.     Right Ear: External ear normal.     Left Ear: External ear normal.     Nose: Nose normal.     Mouth/Throat:     Mouth: Mucous membranes are moist.     Pharynx: Oropharynx is clear.   Eyes:     Extraocular Movements: Extraocular movements intact.     Conjunctiva/sclera: Conjunctivae normal.     Pupils: Pupils are equal, round, and reactive to light.    Cardiovascular:     Rate and Rhythm: Regular rhythm. Tachycardia present.     Pulses: Normal pulses.     Heart sounds: Normal heart sounds.  Pulmonary:     Effort: Pulmonary effort is normal.     Breath sounds: Normal breath sounds.  Abdominal:     General: Abdomen is flat.     Palpations: Abdomen is soft.   Musculoskeletal:        General: Normal range of motion.  Cervical back: Normal range of motion and neck supple.   Skin:    General: Skin is warm.     Capillary Refill: Capillary refill takes less than 2 seconds.   Neurological:     General: No focal deficit present.     Mental Status: She is alert and oriented to person, place, and time.   Psychiatric:        Mood and Affect: Mood normal.        Behavior: Behavior normal.     (all labs ordered are listed, but only abnormal results are displayed) Labs Reviewed  RESP PANEL BY RT-PCR (RSV, FLU A&B, COVID)  [id number]    EKG: None  Radiology: DG Chest 2 View Result Date: 09/23/2023 CLINICAL DATA:  Cough. EXAM: CHEST - 2 VIEW COMPARISON:  11/16/2021 FINDINGS: Normal cardiac silhouette. Central venous congestion noted. No focal infiltrate. No pneumothorax. No pleural fluid. No pulmonary edema. No acute osseous abnormality. IMPRESSION: Venous congestion.  No infiltrate identified  Electronically Signed   By: Jackquline Boxer M.D.   On: 09/23/2023 16:37     Procedures   Medications Ordered in the ED  doxycycline  (VIBRA -TABS) tablet 100 mg (100 mg Oral Given 09/23/23 1719)  guaiFENesin  (ROBITUSSIN) 100 MG/5ML liquid 5 mL (5 mLs Oral Given 09/23/23 1719)                                    Medical Decision Making Amount and/or Complexity of Data Reviewed Radiology: ordered.  Risk OTC drugs. Prescription drug management.   This patient presents to the ED for concern of productive cough, this involves an extensive number of treatment options, and is a complaint that carries with it a high risk of complications and morbidity.  The differential diagnosis includes covid/flu/rsv, pna, bronchitis   Co morbidities that complicate the patient evaluation  Htn and anemia   Additional history obtained:  Additional history obtained from epic chart review External records from outside source obtained and reviewed including husband   Lab Tests:  I Ordered, and personally interpreted labs.  The pertinent results include:  covid/flu/rsv neg   Imaging Studies ordered:  I ordered imaging studies including cxr  I independently visualized and interpreted imaging which showed Venous congestion.  No infiltrate identified  I agree with the radiologist interpretation  Medicines ordered and prescription drug management:  I ordered medication including doxy  for sx  Reevaluation of the patient after these medicines showed that the patient improved I have reviewed the patients home medicines and have made adjustments as needed   Problem List / ED Course:  Productive cough:  covid/flu/rsv neg.  No pna on cxr.  As sx have been going on for 2 weeks, I will put her on doxy.  Pt's HR is a little elevated and I was concerned about anemia.  She does not want to get any blood work here.  She has an appt with obgyn tomorrow to talk about tx for her fibroids.  She's rather wait  until then.  Pt is stable for d/c.  Return if worse. F/u with pcp.   Reevaluation:  After the interventions noted above, I reevaluated the patient and found that they have :improved   Social Determinants of Health:  Lives at home   Dispostion:  After consideration of the diagnostic results and the patients response to treatment, I feel that the patent would benefit from discharge with outpatient f/u.  Final diagnoses:  Acute bronchitis, unspecified organism    ED Discharge Orders          Ordered    doxycycline  (VIBRAMYCIN ) 100 MG capsule  2 times daily        09/23/23 1715    Dextromethorphan-GG-APAP (CORICIDIN HBP COLD/COUGH/FLU) 10-200-325 MG/15ML LIQD  2 times daily PRN        09/23/23 1715    benzonatate (TESSALON) 100 MG capsule  Every 8 hours        09/23/23 1716               Dean Clarity, MD 09/23/23 1720

## 2023-09-24 ENCOUNTER — Ambulatory Visit: Admitting: Obstetrics & Gynecology

## 2023-10-04 ENCOUNTER — Ambulatory Visit: Admitting: Obstetrics & Gynecology

## 2023-10-04 ENCOUNTER — Encounter: Payer: Self-pay | Admitting: Obstetrics & Gynecology

## 2023-10-04 VITALS — BP 145/76 | HR 123 | Ht 65.0 in | Wt 222.2 lb

## 2023-10-04 DIAGNOSIS — I10 Essential (primary) hypertension: Secondary | ICD-10-CM | POA: Diagnosis not present

## 2023-10-04 DIAGNOSIS — N939 Abnormal uterine and vaginal bleeding, unspecified: Secondary | ICD-10-CM | POA: Diagnosis not present

## 2023-10-04 DIAGNOSIS — D252 Subserosal leiomyoma of uterus: Secondary | ICD-10-CM

## 2023-10-04 DIAGNOSIS — D251 Intramural leiomyoma of uterus: Secondary | ICD-10-CM | POA: Diagnosis not present

## 2023-10-04 MED ORDER — AMLODIPINE BESYLATE 5 MG PO TABS: 5.0000 mg | ORAL_TABLET | Freq: Every day | ORAL | 3 refills | Status: AC

## 2023-10-04 MED ORDER — MYFEMBREE 40-1-0.5 MG PO TABS: 1.0000 | ORAL_TABLET | Freq: Every day | ORAL | 0 refills | Status: DC

## 2023-10-04 NOTE — Progress Notes (Signed)
 GYN VISIT Patient name: Alexandra Mclaughlin MRN [id number]  Date of birth: [birth year] Chief Complaint:   Discuss possible surgery  History of Present Illness:   Alexandra Mclaughlin is a 43 y.o. [id number] female being seen today for the following concerns:  AUB: Ongoing issue and in review of her chart- likely for at least the past 1-2 yrs.  Today she notes that for the past year she has had mostly daily bleeding- at times it will lighten up, but for the past few months it has been heavier.  Typically using 2-3 overnight pads per day.  Denies significant dysmenorrhea.  She does have Lysteda  which she takes as needed and states it doesn't help as well as she would like.    Bleeding has led to anemia- required transfusions in the past.  Recent CBC/iron panel not completed due to financial concern.  Recent US  08/2023: 21 x 12 x 17.8 cm.  Volume measuring 2383 cc.  Multiple uterine fibroids including a 10 x 8 x 9 in the lower uterine segment and a 5.7 x 4.1 x 5.2 pedunculated fibroid.  Normal ovaries bilaterally   Review of Systems:   Pertinent items are noted in HPI Denies fever/chills, dizziness, headaches, visual disturbances, fatigue, shortness of breath, chest pain, abdominal pain, vomiting Pertinent History Reviewed:   Past Surgical History:  Procedure Laterality Date   CESAREAN SECTION      Past Medical History:  Diagnosis Date   Anemia    Essential hypertension    Reviewed problem list, medications and allergies. Physical Assessment:   Vitals:   10/04/23 1148  BP: (!) 145/76  Pulse: (!) 123  Weight: 222 lb 3.2 oz (100.8 kg)  Height: 5' 5 (1.651 m)  Body mass index is 36.98 kg/m.       Physical Examination:   General appearance: alert, well appearing, and in no distress  Psych: mood appropriate, normal affect  Skin: warm & dry   Cardiovascular: normal heart rate noted  Respiratory: normal respiratory effort, no distress  Abdomen: enlarged uterus- minimal mobility  extending ~ 5cm above umbilicus  Pelvic: exam declined by the patient  Extremities: no edema   Chaperone: N/A    Assessment & Plan:  1) Abnormal uterine bleeding, Uterine fibroids -reviewed recent US  and findings on exam -recommendation for EMB, pt declined exam -reviewed all management options specifically recommendation for hysterectomy, salpingectomy -Discussed possible robotic- assisted laparoscopic hysterectomy and bilateral salpingectomy  -Explained that this surgery is performed to remove the uterus through several small incisions in the abdomen- pending anatomy 4-5 ports. I discussed the risks and benefits of the surgery, including, but not limited to risk of bleeding, including the need for blood transfusion, infection, damage to surrounding organs and tissues such as damage to bladder, ureter or bowel that would requiring additional procedures.  Reviewed long term complications such as fistula or dehiscence requiring further surgical intervention.  Discussed possible need for conversion to an open procedure and potential for other complications that cannot be predicted or prevented including DVT, PE or death. -reviewed same day procedure -typical recovery 8-12 wks and reviewed recovery expectations  -reviewed alternative options including COLOMBIA, Myfembree, POPs -due to large fibroids, concern for incorrect placement of IUD -reviewed risk/benefit of these options -at this time, she wanted some time to review her options before making a decision  -agreeable for trial of Myfembree- pt to stop Lysteda  and transition to this medication instead -if possible lab work today -f/u in 3 mos or  sooner should she desire to proceed with COLOMBIA or surgical intervention  2) Chronic HTN -no PCP currently -refilled norvasc  until able to re-establish care  Meds ordered this encounter  Medications   amLODipine  (NORVASC ) 5 MG tablet    Sig: Take 1 tablet (5 mg total) by mouth daily.    Dispense:   90 tablet    Refill:  3   Relugolix-Estradiol-Norethind (MYFEMBREE) 40-1-0.5 MG TABS    Sig: Take 1 tablet by mouth daily.    Dispense:  90 tablet    Refill:  0      Orders Placed This Encounter  Procedures   CBC    Return in about 3 months (around 01/04/2024) for Medication follow up with Henley Blyth or [facility] (send to lab today).   Saniyya Gau, DO Attending Obstetrician & Gynecologist, [facility] for [employer], [facility]

## 2023-10-11 ENCOUNTER — Other Ambulatory Visit: Payer: Self-pay | Admitting: *Deleted

## 2023-10-11 DIAGNOSIS — D251 Intramural leiomyoma of uterus: Secondary | ICD-10-CM

## 2023-10-11 DIAGNOSIS — N939 Abnormal uterine and vaginal bleeding, unspecified: Secondary | ICD-10-CM

## 2023-10-11 MED ORDER — MYFEMBREE 40-1-0.5 MG PO TABS: 1.0000 | ORAL_TABLET | Freq: Every day | ORAL | 0 refills | Status: DC

## 2023-10-30 ENCOUNTER — Telehealth: Payer: Self-pay | Admitting: Obstetrics & Gynecology

## 2023-10-30 NOTE — Telephone Encounter (Signed)
 Pt states Norvasc  is not working. Pt would like to change medications. Please Advise.

## 2023-10-30 NOTE — Telephone Encounter (Signed)
 Started Myfembree  last Wednesday and since then the bleeding has gotten heavier.  She is wanting to go back to taking the Lysteda .  Please advise.

## 2023-11-08 ENCOUNTER — Other Ambulatory Visit: Payer: Self-pay | Admitting: Obstetrics & Gynecology

## 2023-11-08 DIAGNOSIS — N920 Excessive and frequent menstruation with regular cycle: Secondary | ICD-10-CM

## 2023-11-08 MED ORDER — TRANEXAMIC ACID 650 MG PO TABS: 1300.0000 mg | ORAL_TABLET | Freq: Three times a day (TID) | ORAL | 6 refills | Status: AC

## 2023-11-08 NOTE — Progress Notes (Signed)
 Change back to Lysteda  Did not see imrpovement with Myfembree   Delon Prude, DO Attending Obstetrician & Gynecologist, [facility] for [employer], [facility]

## 2023-12-31 ENCOUNTER — Ambulatory Visit: Admitting: Obstetrics & Gynecology

## 2024-01-15 ENCOUNTER — Telehealth: Payer: Self-pay | Admitting: Obstetrics & Gynecology

## 2024-01-15 NOTE — Telephone Encounter (Signed)
 Spoke with patient, advised she has refills on rx, needs to let pharmacy know she is ready for a refill.

## 2024-01-15 NOTE — Telephone Encounter (Signed)
 Patient called in regards to a refill for amlodipine  wants to know if someone will call her.

## 2024-01-18 ENCOUNTER — Ambulatory Visit: Admitting: Obstetrics & Gynecology

## 2024-01-31 ENCOUNTER — Ambulatory Visit: Admitting: Obstetrics & Gynecology

## 2024-02-07 ENCOUNTER — Ambulatory Visit: Admitting: Obstetrics & Gynecology

## 2024-02-25 ENCOUNTER — Ambulatory Visit: Admitting: Obstetrics & Gynecology

## 2024-04-24 ENCOUNTER — Ambulatory Visit: Admitting: Adult Health

## 2024-04-29 ENCOUNTER — Ambulatory Visit: Admitting: Adult Health

## 2024-05-05 ENCOUNTER — Encounter: Payer: Self-pay | Admitting: *Deleted

## 2024-05-05 ENCOUNTER — Ambulatory Visit: Admitting: Adult Health

## 2024-05-06 ENCOUNTER — Ambulatory Visit: Admitting: Adult Health

## 2024-05-12 ENCOUNTER — Ambulatory Visit: Admitting: Adult Health

## 2024-05-13 ENCOUNTER — Ambulatory Visit: Admitting: Adult Health
